# Patient Record
Sex: Male | Born: 1980
Health system: Southern US, Community
[De-identification: ages and names within clinical notes are randomized; demographics above are authoritative.]

## PROBLEM LIST (undated history)

## (undated) ENCOUNTER — Ambulatory Visit (HOSPITAL_COMMUNITY): Admission: EM | Payer: 59 | Source: Home / Self Care | Attending: Family Medicine | Admitting: Family Medicine

## (undated) DIAGNOSIS — I10 Essential (primary) hypertension: Secondary | ICD-10-CM

## (undated) HISTORY — PX: WRIST SURGERY: SHX841

---

## 2011-12-03 ENCOUNTER — Encounter (HOSPITAL_BASED_OUTPATIENT_CLINIC_OR_DEPARTMENT_OTHER): Payer: Self-pay | Admitting: *Deleted

## 2011-12-03 ENCOUNTER — Emergency Department (HOSPITAL_BASED_OUTPATIENT_CLINIC_OR_DEPARTMENT_OTHER)
Admission: EM | Admit: 2011-12-03 | Discharge: 2011-12-03 | Disposition: A | Discharge status: Home / Self Care | Payer: Self-pay | Attending: Emergency Medicine | Admitting: Emergency Medicine

## 2011-12-03 DIAGNOSIS — F17200 Nicotine dependence, unspecified, uncomplicated: Secondary | ICD-10-CM | POA: Insufficient documentation

## 2011-12-03 DIAGNOSIS — L0291 Cutaneous abscess, unspecified: Secondary | ICD-10-CM

## 2011-12-03 DIAGNOSIS — H60399 Other infective otitis externa, unspecified ear: Secondary | ICD-10-CM | POA: Insufficient documentation

## 2011-12-03 HISTORY — DX: Essential (primary) hypertension: I10

## 2011-12-03 MED ORDER — CEPHALEXIN 500 MG PO CAPS: 500.0000 mg | ORAL_CAPSULE | Freq: Four times a day (QID) | ORAL | Status: AC

## 2011-12-03 MED ORDER — HYDROCODONE-ACETAMINOPHEN 5-325 MG PO TABS
1.0000 | ORAL_TABLET | Freq: Once | ORAL | Status: AC
  Administered 2011-12-03: 1 via ORAL
  Filled 2011-12-03: qty 1

## 2011-12-03 MED ORDER — CEPHALEXIN 250 MG PO CAPS
500.0000 mg | ORAL_CAPSULE | Freq: Once | ORAL | Status: AC
  Administered 2011-12-03: 500 mg via ORAL
  Filled 2011-12-03: qty 2

## 2011-12-03 MED ORDER — HYDROCODONE-ACETAMINOPHEN 5-325 MG PO TABS: 1.0000 | ORAL_TABLET | ORAL | Status: AC | PRN

## 2011-12-03 NOTE — ED Provider Notes (Signed)
History     CSN: [id number]  Arrival date & time 12/03/11  1331   First MD Initiated Contact with Patient 12/03/11 1515      Chief Complaint  Patient presents with  . Abscess    (Consider location/radiation/quality/duration/timing/severity/associated sxs/prior treatment) HPI Comments: Ryan Moss presents for treatment of an abscess at the verge of his right ear canal which has been intermittently draining for the past 2 weeks but will not resolve and has now become more painful.    He denies fevers,  Chills, facial swelling, nausea and vomiting.  He does have a history of occasional skin blemishes which come and go, but they have always resolved without intervention.  He denies other complaints.  The history is provided by the patient and the spouse.    Past Medical History  Diagnosis Date  . Hypertension     History reviewed. No pertinent past surgical history.  History reviewed. No pertinent family history.  History  Substance Use Topics  . Smoking status: Current Everyday Smoker -- 1.0 packs/day  . Smokeless tobacco: Not on file  . Alcohol Use: No      Review of Systems  Constitutional: Negative for fever and chills.  HENT: Negative for facial swelling.   Respiratory: Negative for shortness of breath and wheezing.   Skin:       Otherwise negative     Allergies  Review of patient's allergies indicates no known allergies.  Home Medications   Current Outpatient Rx  Name Route Sig Dispense Refill  . ACETAMINOPHEN 500 MG PO TABS Oral Take 1,000 mg by mouth every 6 (six) hours as needed. For pain.    Marland Kitchen EAR DROPS OT Otic Place 3-5 drops in ear(s) daily as needed. For ear pain.    . CEPHALEXIN 500 MG PO CAPS Oral Take 1 capsule (500 mg total) by mouth 4 (four) times daily. 40 capsule 0  . HYDROCODONE-ACETAMINOPHEN 5-325 MG PO TABS Oral Take 1 tablet by mouth every 4 (four) hours as needed for pain. 15 tablet 0    BP 145/79  Pulse 57  Temp 98.4 F (36.9 C)  (Oral)  Resp 20  Ht 5\' 8"  (1.727 m)  Wt 190 lb (86.183 kg)  BMI 28.89 kg/m2  SpO2 98%  Physical Exam  Constitutional: He appears well-developed and well-nourished. No distress.  HENT:  Head: Normocephalic.  Neck: Neck supple.  Cardiovascular: Normal rate.   Pulmonary/Chest: Effort normal. He has no wheezes.  Musculoskeletal: Normal range of motion. He exhibits no edema.  Skin:       Small, raised abscess at right distal ear canal, fluctuant,nondraining central punctum.  He has several sebum filled large pores on the medial helix.      ED Course  Procedures (including critical care time)  Labs Reviewed - No data to display No results found.   1. Abscess    INCISION AND DRAINAGE Performed by: Burgess Amor Consent: Verbal consent obtained. Risks and benefits: risks, benefits and alternatives were discussed Type: abscess  Body area: right outer ear canal  Anesthesia: none Local anesthetic:none  Anesthetic total: na Complexity: simple   Drainage: purulent  Drainage amount: small amount of purulent drainage along with sebum  Packing material: not amenable to packing. Patient tolerance: Patient tolerated the procedure well with no immediate complications.      MDM  Pt placed on keflex along with hydrocodone for pain relief.  Suggested warm compresses and gentle massage to keep the area opened and draining.  Pt has scattered plugged comedones on face and ears and on surface of current infection - history and presentation not consistent with possible mrsa - more suggestive of sebaceous cyst/ hydradenitis source of skin problems.         Burgess Amor, [place] 12/03/11 2318

## 2011-12-03 NOTE — ED Notes (Signed)
Pt c/o abscess to right ear x 2 weeks

## 2011-12-04 NOTE — ED Provider Notes (Signed)
Medical screening examination/treatment/procedure(s) were performed by non-physician practitioner and as supervising physician I was immediately available for consultation/collaboration.   Hurman Horn, MD 12/04/11 [phone]

## 2015-09-28 ENCOUNTER — Encounter (HOSPITAL_BASED_OUTPATIENT_CLINIC_OR_DEPARTMENT_OTHER): Payer: Self-pay

## 2015-09-28 ENCOUNTER — Emergency Department (HOSPITAL_BASED_OUTPATIENT_CLINIC_OR_DEPARTMENT_OTHER)
Admission: EM | Admit: 2015-09-28 | Discharge: 2015-09-28 | Disposition: A | Discharge status: Home / Self Care | Payer: Self-pay | Attending: Emergency Medicine | Admitting: Emergency Medicine

## 2015-09-28 DIAGNOSIS — F17200 Nicotine dependence, unspecified, uncomplicated: Secondary | ICD-10-CM | POA: Insufficient documentation

## 2015-09-28 DIAGNOSIS — W57XXXA Bitten or stung by nonvenomous insect and other nonvenomous arthropods, initial encounter: Secondary | ICD-10-CM | POA: Insufficient documentation

## 2015-09-28 DIAGNOSIS — S90562A Insect bite (nonvenomous), left ankle, initial encounter: Secondary | ICD-10-CM | POA: Insufficient documentation

## 2015-09-28 DIAGNOSIS — Y939 Activity, unspecified: Secondary | ICD-10-CM | POA: Insufficient documentation

## 2015-09-28 DIAGNOSIS — I10 Essential (primary) hypertension: Secondary | ICD-10-CM | POA: Insufficient documentation

## 2015-09-28 DIAGNOSIS — Y999 Unspecified external cause status: Secondary | ICD-10-CM | POA: Insufficient documentation

## 2015-09-28 DIAGNOSIS — Y929 Unspecified place or not applicable: Secondary | ICD-10-CM | POA: Insufficient documentation

## 2015-09-28 MED ORDER — HYDROCORTISONE 2.5 % EX LOTN: TOPICAL_LOTION | Freq: Two times a day (BID) | CUTANEOUS | Status: DC

## 2015-09-28 MED ORDER — DOXYCYCLINE HYCLATE 100 MG PO CAPS: 100.0000 mg | ORAL_CAPSULE | Freq: Two times a day (BID) | ORAL | Status: DC

## 2015-09-28 NOTE — ED Notes (Signed)
Pt reports possible insect bite to left lateral ankle - reports onset of pain localized to area yesterday evening, redness noted last night with edema. Pt states he was he was working outside in a shed yesterday and noticed insects crawling around.

## 2015-09-28 NOTE — Discharge Instructions (Signed)
Cellulitis Cellulitis is an infection of the skin and the tissue beneath it. The infected area is usually red and tender. Cellulitis occurs most often in the arms and lower legs.  CAUSES  Cellulitis is caused by bacteria that enter the skin through cracks or cuts in the skin. The most common types of bacteria that cause cellulitis are staphylococci and streptococci. SIGNS AND SYMPTOMS   Redness and warmth.  Swelling.  Tenderness or pain.  Fever. DIAGNOSIS  Your health care provider can usually determine what is wrong based on a physical exam. Blood tests may also be done. TREATMENT  Treatment usually involves taking an antibiotic medicine. HOME CARE INSTRUCTIONS   Take your antibiotic medicine as directed by your health care provider. Finish the antibiotic even if you start to feel better.  Keep the infected arm or leg elevated to reduce swelling.  Apply a warm cloth to the affected area up to 4 times per day to relieve pain.  Take medicines only as directed by your health care provider.  Keep all follow-up visits as directed by your health care provider. SEEK MEDICAL CARE IF:  1. You notice red streaks coming from the infected area. 2. Your red area gets larger or turns dark in color. 3. Your bone or joint underneath the infected area becomes painful after the skin has healed. 4. Your infection returns in the same area or another area. 5. You notice a swollen bump in the infected area. 6. You develop new symptoms. 7. You have a fever. SEEK IMMEDIATE MEDICAL CARE IF:   You feel very sleepy.  You develop vomiting or diarrhea.  You have a general ill feeling (malaise) with muscle aches and pains.   This information is not intended to replace advice given to you by your health care provider. Make sure you discuss any questions you have with your health care provider.   Document Released: 01/20/2005 Document Revised: 01/01/2015 Document Reviewed: 06/28/2011 Elsevier  Interactive Patient Education 2016 Elsevier Inc.  Tick Bite Information Ticks are insects that attach themselves to the skin and draw blood for food. There are various types of ticks. Common types include wood ticks and deer ticks. Most ticks live in shrubs and grassy areas. Ticks can climb onto your body when you make contact with leaves or grass where the tick is waiting. The most common places on the body for ticks to attach themselves are the scalp, neck, armpits, waist, and groin. Most tick bites are harmless, but sometimes ticks carry germs that cause diseases. These germs can be spread to a person during the tick's feeding process. The chance of a disease spreading through a tick bite depends on:   The type of tick.  Time of year.   How long the tick is attached.   Geographic location.  HOW CAN YOU PREVENT TICK BITES? Take these steps to help prevent tick bites when you are outdoors:  Wear protective clothing. Long sleeves and long pants are best.   Wear white clothes so you can see ticks more easily.  Tuck your pant legs into your socks.   If walking on a trail, stay in the middle of the trail to avoid brushing against bushes.  Avoid walking through areas with long grass.  Put insect repellent on all exposed skin and along boot tops, pant legs, and sleeve cuffs.   Check clothing, hair, and skin repeatedly and before going inside.   Brush off any ticks that are not attached.  Take a  shower or bath as soon as possible after being outdoors.  WHAT IS THE PROPER WAY TO REMOVE A TICK? Ticks should be removed as soon as possible to help prevent diseases caused by tick bites. 8. If latex gloves are available, put them on before trying to remove a tick.  9. Using fine-point tweezers, grasp the tick as close to the skin as possible. You may also use curved forceps or a tick removal tool. Grasp the tick as close to its head as possible. Avoid grasping the tick on its  body. 10. Pull gently with steady upward pressure until the tick lets go. Do not twist the tick or jerk it suddenly. This may break off the tick's head or mouth parts. 11. Do not squeeze or crush the tick's body. This could force disease-carrying fluids from the tick into your body.  12. After the tick is removed, wash the bite area and your hands with soap and water or other disinfectant such as alcohol. 13. Apply a small amount of antiseptic cream or ointment to the bite site.  14. Wash and disinfect any instruments that were used.  Do not try to remove a tick by applying a hot match, petroleum jelly, or fingernail polish to the tick. These methods do not work and may increase the chances of disease being spread from the tick bite.  WHEN SHOULD YOU SEEK MEDICAL CARE? Contact your health care provider if you are unable to remove a tick from your skin or if a part of the tick breaks off and is stuck in the skin.  After a tick bite, you need to be aware of signs and symptoms that could be related to diseases spread by ticks. Contact your health care provider if you develop any of the following in the days or weeks after the tick bite:  Unexplained fever.  Rash. A circular rash that appears days or weeks after the tick bite may indicate the possibility of Lyme disease. The rash may resemble a target with a bull's-eye and may occur at a different part of your body than the tick bite.  Redness and swelling in the area of the tick bite.   Tender, swollen lymph glands.   Diarrhea.   Weight loss.   Cough.   Fatigue.   Muscle, joint, or bone pain.   Abdominal pain.   Headache.   Lethargy or a change in your level of consciousness.  Difficulty walking or moving your legs.   Numbness in the legs.   Paralysis.  Shortness of breath.   Confusion.   Repeated vomiting.    This information is not intended to replace advice given to you by your health care provider.  Make sure you discuss any questions you have with your health care provider.   Document Released: 04/09/2000 Document Revised: 05/03/2014 Document Reviewed: 09/20/2012 Elsevier Interactive Patient Education [employer]2016 Elsevier Inc.

## 2015-09-28 NOTE — ED Provider Notes (Signed)
CSN: [id number]     Arrival date & time 09/28/15  0850 History   First MD Initiated Contact with Patient 09/28/15 [phone]     Chief Complaint  Patient presents with  . Insect Bite    Sabino Dickravis Louis is a 35 y.o. male who presents to the ED complaining of an insect bite to his left lateral ankle since yesterday. The patient reports he was cleaning a shed yesterday and he saw lots of insects. He reports last night he noticed pain, swelling, redness and itching to his left lateral ankle. This has persisted today. He has put coco butter on his insect bite. He did not see an insect bite him. He denies fevers, body aches, other insect bites, abdominal pain, nausea, vomiting, neck pain, other rashes.    The history is provided by the patient. No language interpreter was used.    Past Medical History  Diagnosis Date  . Hypertension    History reviewed. No pertinent past surgical history. History reviewed. No pertinent family history. Social History  Substance Use Topics  . Smoking status: Current Every Day Smoker -- 1.00 packs/day  . Smokeless tobacco: None  . Alcohol Use: No    Review of Systems  Constitutional: Negative for fever.  Gastrointestinal: Negative for nausea, vomiting and abdominal pain.  Musculoskeletal: Negative for myalgias, arthralgias and neck pain.  Skin: Positive for color change and rash.  Neurological: Negative for weakness and numbness.      Allergies  Review of patient's allergies indicates no known allergies.  Home Medications   Prior to Admission medications   Medication Sig Start Date End Date Taking? Authorizing Provider  acetaminophen (TYLENOL) 500 MG tablet Take 1,000 mg by mouth every 6 (six) hours as needed. For pain.    Historical Provider, MD  Carbamide Peroxide (EAR DROPS OT) Place 3-5 drops in ear(s) daily as needed. For ear pain.    Historical Provider, MD  doxycycline (VIBRAMYCIN) 100 MG capsule Take 1 capsule (100 mg total) by mouth 2 (two)  times daily. 09/28/15   Everlene FarrierWilliam Rayquan Amrhein, PA-C  hydrocortisone 2.5 % lotion Apply topically 2 (two) times daily. 09/28/15   Everlene FarrierWilliam Franchon Ketterman, PA-C   BP 127/83 mmHg  Pulse 94  Temp(Src) 98.9 F (37.2 C) (Oral)  Resp 20  Ht 5\' 8"  (1.727 m)  Wt 81.647 kg  BMI 27.38 kg/m2  SpO2 98% Physical Exam  Constitutional: He appears well-developed and well-nourished. No distress.  Nontoxic appearing.  HENT:  Head: Normocephalic and atraumatic.  Eyes: Right eye exhibits no discharge. Left eye exhibits no discharge.  Cardiovascular: Normal rate, regular rhythm and intact distal pulses.   Bilateral dorsalis pedis and posterior tibialis pulses are intact. Good capillary refill to his distal toes.  Pulmonary/Chest: Effort normal. No respiratory distress.  Musculoskeletal: Normal range of motion.  See skin. Good left ankle ROM. No calf edema or tenderness bilaterally.   Neurological: He is alert. Coordination normal.  Skin: Skin is warm and dry. He is not diaphoretic. There is erythema. No pallor.  4 cm area of erythema and mild edema to his left lateral ankle. No abscess. No bite marks. No open wounds. No fluctuance. No vesicles or bulla. No streaking erythema.   Psychiatric: He has a normal mood and affect. His behavior is normal.  Nursing note and vitals reviewed.   ED Course  Procedures (including critical care time) Labs Review Labs Reviewed - No data to display  Imaging Review No results found.    EKG Interpretation None  Filed Vitals:   09/28/15 0859  BP: 127/83  Pulse: 94  Temp: 98.9 F (37.2 C)  TempSrc: Oral  Resp: 20  Height:  (1.727 m)  Weight: 81.647 kg  SpO2: 98%     MDM   Meds given in ED:  Medications - No data to display  New Prescriptions   DOXYCYCLINE (VIBRAMYCIN) 100 MG CAPSULE    Take 1 capsule (100 mg total) by mouth 2 (two) times daily.   HYDROCORTISONE 2.5 % LOTION    Apply topically 2 (two) times daily.    Final diagnoses:  Insect bite   This   is a 35 y.o. male who presents to the ED complaining of an insect bite to his left lateral ankle since yesterday. The patient reports he was cleaning a shed yesterday and he saw lots of insects. He reports last night he noticed pain, swelling, redness and itching to his left lateral ankle. This has persisted today. He has put coco butter on his insect bite. He did not see an insect bite him. He denies fevers, body aches.  On exam the patient is afebrile and non-toxic appearing. He has a 4 cm area of erythema concerning for cellulitis to his left lateral ankle. No abscess or vesicles. No streaking erythema. Will discharge with Rx for doxycycline and hydrocortisone lotion for itching. I encouraged close follow up by PCP for recheck. I discussed strict and specific return precautions. I advised the patient to follow-up with their primary care provider this week. I advised the patient to return to the emergency department with new or worsening symptoms or new concerns. The patient verbalized understanding and agreement with plan.       Everlene Farrier, PA-C 09/28/15 [id number]  Lavera Guise, MD 09/28/15 1054

## 2015-09-29 MED FILL — DOXYCYCLINE HYC 100 MG CAP: 100 | 10 days supply | Qty: 20 | Fill #0

## 2015-09-29 MED FILL — HYDROCORTISONE 2.5% LOTION: 2.5 | 20 days supply | Qty: 59 | Fill #0

## 2015-10-01 ENCOUNTER — Inpatient Hospital Stay (HOSPITAL_BASED_OUTPATIENT_CLINIC_OR_DEPARTMENT_OTHER)
Admission: EM | Admit: 2015-10-01 | Discharge: 2015-10-04 | DRG: 603 | Disposition: A | Discharge status: Home / Self Care | Payer: Self-pay | Attending: Family Medicine | Admitting: Family Medicine

## 2015-10-01 ENCOUNTER — Encounter (HOSPITAL_BASED_OUTPATIENT_CLINIC_OR_DEPARTMENT_OTHER): Payer: Self-pay

## 2015-10-01 ENCOUNTER — Emergency Department (HOSPITAL_BASED_OUTPATIENT_CLINIC_OR_DEPARTMENT_OTHER): Payer: Self-pay

## 2015-10-01 DIAGNOSIS — R7303 Prediabetes: Secondary | ICD-10-CM | POA: Diagnosis present

## 2015-10-01 DIAGNOSIS — Z87891 Personal history of nicotine dependence: Secondary | ICD-10-CM

## 2015-10-01 DIAGNOSIS — L0390 Cellulitis, unspecified: Secondary | ICD-10-CM | POA: Diagnosis present

## 2015-10-01 DIAGNOSIS — L03116 Cellulitis of left lower limb: Principal | ICD-10-CM | POA: Insufficient documentation

## 2015-10-01 DIAGNOSIS — I10 Essential (primary) hypertension: Secondary | ICD-10-CM | POA: Diagnosis present

## 2015-10-01 LAB — CBC WITH DIFFERENTIAL/PLATELET
BASOS PCT: 0 %
Basophils Absolute: 0 10*3/uL (ref 0.0–0.1)
EOS PCT: 2 %
Eosinophils Absolute: 0.2 10*3/uL (ref 0.0–0.7)
HEMATOCRIT: 36.8 % — AB (ref 39.0–52.0)
HEMOGLOBIN: 12.8 g/dL — AB (ref 13.0–17.0)
LYMPHS PCT: 24 %
Lymphs Abs: 2.7 10*3/uL (ref 0.7–4.0)
MCH: 28.3 pg (ref 26.0–34.0)
MCHC: 34.8 g/dL (ref 30.0–36.0)
MCV: 81.4 fL (ref 78.0–100.0)
MONO ABS: 1.5 10*3/uL — AB (ref 0.1–1.0)
Monocytes Relative: 14 %
Neutro Abs: 6.6 10*3/uL (ref 1.7–7.7)
Neutrophils Relative %: 60 %
Platelets: 240 10*3/uL (ref 150–400)
RBC: 4.52 MIL/uL (ref 4.22–5.81)
RDW: 13 % (ref 11.5–15.5)
WBC: 11 10*3/uL — AB (ref 4.0–10.5)

## 2015-10-01 LAB — COMPREHENSIVE METABOLIC PANEL
ALBUMIN: 4 g/dL (ref 3.5–5.0)
ALK PHOS: 80 U/L (ref 38–126)
ALT: 30 U/L (ref 17–63)
ANION GAP: 7 (ref 5–15)
AST: 34 U/L (ref 15–41)
BILIRUBIN TOTAL: 1.4 mg/dL — AB (ref 0.3–1.2)
BUN: 19 mg/dL (ref 6–20)
CALCIUM: 8.6 mg/dL — AB (ref 8.9–10.3)
CO2: 29 mmol/L (ref 22–32)
CREATININE: 1.04 mg/dL (ref 0.61–1.24)
Chloride: 103 mmol/L (ref 101–111)
GFR calc Af Amer: >60 mL/min (ref >60)
GFR calc non Af Amer: >60 mL/min (ref >60)
GLUCOSE: 134 mg/dL — AB (ref 65–99)
Potassium: 3.6 mmol/L (ref 3.5–5.1)
Sodium: 139 mmol/L (ref 135–145)
TOTAL PROTEIN: 7.3 g/dL (ref 6.5–8.1)

## 2015-10-01 LAB — SEDIMENTATION RATE: Sed Rate: 7 mm/hr (ref 0–16)

## 2015-10-01 MED ORDER — HYDROMORPHONE HCL 1 MG/ML IJ SOLN
1.0000 mg | Freq: Once | INTRAMUSCULAR | Status: AC
  Administered 2015-10-01: 1 mg via INTRAVENOUS
  Filled 2015-10-01: qty 1

## 2015-10-01 MED ORDER — SODIUM CHLORIDE 0.9 % IV BOLUS (SEPSIS)
1000.0000 mL | Freq: Once | INTRAVENOUS | Status: AC
  Administered 2015-10-01: 1000 mL via INTRAVENOUS

## 2015-10-01 MED ORDER — VANCOMYCIN HCL 500 MG IV SOLR
INTRAVENOUS | Status: AC
  Filled 2015-10-01: qty 500

## 2015-10-01 MED ORDER — VANCOMYCIN HCL 10 G IV SOLR
1500.0000 mg | Freq: Once | INTRAVENOUS | Status: AC
  Administered 2015-10-01: 1500 mg via INTRAVENOUS
  Filled 2015-10-01: qty 1500

## 2015-10-01 MED ORDER — VANCOMYCIN HCL IN DEXTROSE 1-5 GM/200ML-% IV SOLN
INTRAVENOUS | Status: AC
  Filled 2015-10-01: qty 200

## 2015-10-01 NOTE — ED Notes (Signed)
MD at bedside. 

## 2015-10-01 NOTE — ED Provider Notes (Signed)
CSN: [id number]     Arrival date & time 10/01/15  1847 History  By signing my name below, I, Soijett Blue, attest that this documentation has been prepared under the direction and in the presence of Harvel Quale, MD. Electronically Signed: Soijett Blue, ED Scribe. 10/01/2015. 8:52 PM.   Chief Complaint  Patient presents with  . Wound Infection      The history is provided by the patient. No language interpreter was used.    HPI Comments: Ryan Moss is a 35 y.o. male with a PMHx of HTN, who presents to the Emergency Department complaining of wound infection to left ankle onset 4 days. Pt initially had an insect bite to his left ankle that was small and red, pt is unsure of the insect. Pt reports that he was seen in the ED recently and Rx Doxycycline for his symptoms but she reports she has been taking. Denies having issues with bad infections in the past. Denies having ankle/foot pain in the past. Pt notes that he doesn't feel well. He states that he is having associated symptoms of redness to left ankle, chills, left foot pain, gait problem due to pain, and left ankle swelling. He states that he has tried Rx abx with no relief for his symptoms. He denies any other symptoms. Denies PMHx of DM at this time. Denies allergies to medications at this time.   Per pt chart review: Pt was seen in the ED on 09/28/2015 for insect bite to left ankle. Pt was Rx doxycycline and hydrocortisone cream for his symptoms.   Past Medical History  Diagnosis Date  . Hypertension    History reviewed. No pertinent past surgical history. No family history on file. Social History  Substance Use Topics  . Smoking status: Current Every Day Smoker -- 1.00 packs/day  . Smokeless tobacco: None  . Alcohol Use: No    Review of Systems  Constitutional: Positive for chills. Negative for fever.  Musculoskeletal: Positive for joint swelling (left ankle), arthralgias (left foot and ankle) and gait problem (due to  pain).  Skin: Positive for color change (redness to left ankle).  All other systems reviewed and are negative.   Allergies  Review of patient's allergies indicates no known allergies.  Home Medications   Prior to Admission medications   Medication Sig Start Date End Date Taking? Authorizing Provider  doxycycline (VIBRAMYCIN) 100 MG capsule Take 1 capsule (100 mg total) by mouth 2 (two) times daily. 09/28/15   Waynetta Pean, PA-C  hydrocortisone 2.5 % lotion Apply topically 2 (two) times daily. 09/28/15   Waynetta Pean, PA-C   BP 120/77 mmHg  Pulse 91  Temp(Src) 98.3 F (36.8 C) (Oral)  Resp 20  Ht 5' 8"  (1.727 m)  Wt 180 lb (81.647 kg)  BMI 27.38 kg/m2  SpO2 92% Physical Exam  Constitutional: He is oriented to person, place, and time. He appears well-developed and well-nourished. No distress.  Non-toxic, but ill appearing.   HENT:  Head: Normocephalic and atraumatic.  Eyes: EOM are normal.  Neck: Neck supple.  Cardiovascular: Regular rhythm and normal heart sounds.  Tachycardia present.  Exam reveals no gallop and no friction rub.   No murmur heard. Pulmonary/Chest: Effort normal and breath sounds normal. No respiratory distress. He has no wheezes. He has no rales.  Abdominal: Soft. He exhibits no distension. There is no tenderness.  Musculoskeletal:       Left ankle: He exhibits decreased range of motion (due to pain) and swelling.  Tenderness.  Significant circumferential swelling and erythema to left ankle. Significant tenderness to left ankle. With minimal movement, pt has pain to his left ankle. Decreased ROM due to pain. Good dp pulses.   Neurological: He is alert and oriented to person, place, and time.  Skin: Skin is warm and dry.  Psychiatric: He has a normal mood and affect. His behavior is normal.  Nursing note and vitals reviewed.   ED Course  Procedures (including critical care time) DIAGNOSTIC STUDIES: Oxygen Saturation is 97% on RA, nl by my interpretation.     COORDINATION OF CARE: 8:50 PM Discussed treatment plan with pt at bedside which includes left ankle xray, labs, dilaudid, vancomycin, and pt agreed to plan.    Labs Review Labs Reviewed  CBC WITH DIFFERENTIAL/PLATELET - Abnormal; Notable for the following:    WBC 11.0 (*)    Hemoglobin 12.8 (*)    HCT 36.8 (*)    Monocytes Absolute 1.5 (*)    All other components within normal limits  COMPREHENSIVE METABOLIC PANEL - Abnormal; Notable for the following:    Glucose, Bld 134 (*)    Calcium 8.6 (*)    Total Bilirubin 1.4 (*)    All other components within normal limits  CULTURE, BLOOD (ROUTINE X 2)  CULTURE, BLOOD (ROUTINE X 2)  SEDIMENTATION RATE  C-REACTIVE PROTEIN    Imaging Review Dg Ankle Complete Left  10/01/2015  CLINICAL DATA:  Left ankle pain following insect bite laterally 1 week ago, initial encounter EXAM: LEFT ANKLE COMPLETE - 3+ VIEW COMPARISON:  None. FINDINGS: Soft tissue swelling is noted laterally. No acute fracture or dislocation is seen. No gross bony abnormality is noted. IMPRESSION: Soft tissue swelling consistent with the injury. No acute bony abnormality noted. Electronically Signed   By: Inez Catalina M.D.   On: 10/01/2015 22:57   I have personally reviewed and evaluated these images and lab results as part of my medical decision-making.   EKG Interpretation None      MDM  Patient was seen and evaluated in stable condition. Patient appeared ill but not toxic on examination. Patient with significant pain and swelling over the lateral foot and ankle. Patient did have significant pain with range of motion of the ankle itself. Skin was darkened and warm up to the mid shin. Physical examination consistent with cellulitis. Patient mildly tachycardic and with leukocytosis. Patient was given a dose of vancomycin. ESR was normal. X-ray without acute finding other than soft tissue swelling. At this time feel this is most likely cellulitis and not a septic joint. I  would not feel comfortable doing an arthrocentesis in light of the infected skin surrounding the ankle joints at this time anyway. Patient does have a normal ESR and clinically I feel this is most likely cellulitis. Discussed case with Dr. Aggie Moats from [facility] who agreed with admission. Patient was admitted under his care in stable condition. Final diagnoses:  Cellulitis of left lower extremity    1. Cellulitis, left lower extremity  I personally performed the services described in this documentation, which was scribed in my presence. The recorded information has been reviewed and is accurate.   Harvel Quale, MD 10/02/15 [phone]

## 2015-10-01 NOTE — ED Notes (Signed)
Dx with infection to left ankle Sunday-started abx-states increase in swelling-slow limping gait

## 2015-10-02 ENCOUNTER — Encounter (HOSPITAL_COMMUNITY): Payer: Self-pay | Admitting: Family Medicine

## 2015-10-02 DIAGNOSIS — I159 Secondary hypertension, unspecified: Secondary | ICD-10-CM

## 2015-10-02 DIAGNOSIS — I10 Essential (primary) hypertension: Secondary | ICD-10-CM

## 2015-10-02 DIAGNOSIS — L03116 Cellulitis of left lower limb: Principal | ICD-10-CM | POA: Insufficient documentation

## 2015-10-02 DIAGNOSIS — L0390 Cellulitis, unspecified: Secondary | ICD-10-CM | POA: Diagnosis present

## 2015-10-02 LAB — BASIC METABOLIC PANEL
ANION GAP: 5 (ref 5–15)
BUN: 13 mg/dL (ref 6–20)
CHLORIDE: 106 mmol/L (ref 101–111)
CO2: 26 mmol/L (ref 22–32)
Calcium: 8.2 mg/dL — ABNORMAL LOW (ref 8.9–10.3)
Creatinine, Ser: 0.95 mg/dL (ref 0.61–1.24)
GFR calc Af Amer: >60 mL/min (ref >60)
Glucose, Bld: 163 mg/dL — ABNORMAL HIGH (ref 65–99)
POTASSIUM: 3.6 mmol/L (ref 3.5–5.1)
SODIUM: 137 mmol/L (ref 135–145)

## 2015-10-02 LAB — CBC
HEMATOCRIT: 35.9 % — AB (ref 39.0–52.0)
HEMOGLOBIN: 11.6 g/dL — AB (ref 13.0–17.0)
MCH: 26.6 pg (ref 26.0–34.0)
MCHC: 32.3 g/dL (ref 30.0–36.0)
MCV: 82.3 fL (ref 78.0–100.0)
Platelets: 226 10*3/uL (ref 150–400)
RBC: 4.36 MIL/uL (ref 4.22–5.81)
RDW: 13.3 % (ref 11.5–15.5)
WBC: 9.1 10*3/uL (ref 4.0–10.5)

## 2015-10-02 LAB — C-REACTIVE PROTEIN: CRP: 2.8 mg/dL — ABNORMAL HIGH (ref <1.0)

## 2015-10-02 MED ORDER — POLYETHYLENE GLYCOL 3350 17 G PO PACK: 17.0000 g | PACK | Freq: Every day | ORAL | Status: DC | PRN

## 2015-10-02 MED ORDER — ENOXAPARIN SODIUM 40 MG/0.4ML ~~LOC~~ SOLN
40.0000 mg | SUBCUTANEOUS | Status: DC
  Administered 2015-10-02 – 2015-10-03 (×2): 40 mg via SUBCUTANEOUS
  Filled 2015-10-02 (×2): qty 0.4

## 2015-10-02 MED ORDER — SODIUM CHLORIDE 0.9% FLUSH
3.0000 mL | Freq: Two times a day (BID) | INTRAVENOUS | Status: DC
  Administered 2015-10-02 – 2015-10-03 (×4): 3 mL via INTRAVENOUS

## 2015-10-02 MED ORDER — VANCOMYCIN HCL IN DEXTROSE 1-5 GM/200ML-% IV SOLN
1000.0000 mg | Freq: Two times a day (BID) | INTRAVENOUS | Status: DC
  Administered 2015-10-02 – 2015-10-04 (×5): 1000 mg via INTRAVENOUS
  Filled 2015-10-02 (×6): qty 200

## 2015-10-02 MED ORDER — KCL IN DEXTROSE-NACL 20-5-0.45 MEQ/L-%-% IV SOLN
INTRAVENOUS | Status: AC
  Administered 2015-10-02: 04:00:00 via INTRAVENOUS
  Filled 2015-10-02: qty 1000

## 2015-10-02 MED ORDER — ACETAMINOPHEN 325 MG PO TABS
650.0000 mg | ORAL_TABLET | Freq: Four times a day (QID) | ORAL | Status: DC | PRN
  Administered 2015-10-03: 650 mg via ORAL
  Filled 2015-10-02: qty 2

## 2015-10-02 MED ORDER — HYDROMORPHONE HCL 1 MG/ML IJ SOLN
0.5000 mg | INTRAMUSCULAR | Status: DC | PRN
  Administered 2015-10-02 – 2015-10-04 (×10): 0.5 mg via INTRAVENOUS
  Filled 2015-10-02 (×10): qty 1

## 2015-10-02 MED ORDER — COLCHICINE 0.6 MG PO TABS
1.2000 mg | ORAL_TABLET | Freq: Once | ORAL | Status: AC
Start: 2015-10-02 — End: 2015-10-02
  Administered 2015-10-02: 1.2 mg via ORAL
  Filled 2015-10-02: qty 2

## 2015-10-02 MED ORDER — HYDRALAZINE HCL 20 MG/ML IJ SOLN: 10.0000 mg | Freq: Three times a day (TID) | INTRAMUSCULAR | Status: DC | PRN

## 2015-10-02 MED ORDER — ACETAMINOPHEN 650 MG RE SUPP: 650.0000 mg | Freq: Four times a day (QID) | RECTAL | Status: DC | PRN

## 2015-10-02 NOTE — ED Notes (Signed)
Skin marker used to mark the area of infection on L ankle.

## 2015-10-02 NOTE — H&P (Signed)
History and Physical    Ryan Moss ZOX:[id number] DOB: 29-Jan-1981 DOA: 10/01/2015  PCP: No primary care provider on file.  Patient coming from: home  Chief Complaint: "I think I got bit or something."  HPI: Ryan Moss is a 35 y.o. male with medical history significant of 35 year old male with no sniff can past medical history went to the emergency room for worsening cellulitis. Patient states that he went to the ED 2 days ago after he been bitten by insect the day before while on a shed. Redness initially started on the shin with small circular. This spread to his foot his foot became red and swollen. Now stable to the point that patient cannot walk and bear weight on left side. Patient denies any fluctuance or area patient denies any temperature surgery. Patient has no history of IV drug use. Patient has no history of MRSA.  Denies fevers chills nausea vomiting cough wheezing syncope presyncope headache blurry vision double vision trouble hearing diarrhea constipation dysuria hematuria.  Patient when he presented to the emergency room today appeared to be in mild to moderate illness distress. He was tachycardic. He responded rapidly to vancomycin and IV hydration. ED provider than contact the hospitalist service for admission.  Review of Systems: As per HPI otherwise 10 point review of systems negative.   Past Medical History  Diagnosis Date  . Hypertension     History reviewed. No pertinent past surgical history.   reports that he has been smoking.  He does not have any smokeless tobacco history on file. He reports that he does not drink alcohol or use illicit drugs.  No Known Allergies  Family History  Problem Relation Age of Onset  . Hypertension Mother      Prior to Admission medications   Medication Sig Start Date End Date Taking? Authorizing Provider  doxycycline (VIBRAMYCIN) 100 MG capsule Take 1 capsule (100 mg total) by mouth 2 (two) times daily. 09/28/15   Everlene Farrier, PA-C  hydrocortisone 2.5 % lotion Apply topically 2 (two) times daily. 09/28/15   Everlene Farrier, PA-C    Physical Exam: Filed Vitals:   10/01/15 2250 10/01/15 2300 10/02/15 0039 10/02/15 0123  BP: 120/77 114/79 130/80 119/81  Pulse: 91 93 93 86  Temp:   98.5 F (36.9 C) 98.8 F (37.1 C)  TempSrc:    Oral  Resp: Height:      Weight:      SpO2: 92% 92% 93% 93%      Constitutional: NAD, calm, comfortable Filed Vitals:   10/01/15 2250 10/01/15 2300 10/02/15 0039 10/02/15 0123  BP: 120/77 114/79 130/80 119/81  Pulse: 91 93 93 86  Temp:   98.5 F (36.9 C) 98.8 F (37.1 C)  TempSrc:    Oral  Resp: Height:      Weight:      SpO2: 92% 92% 93% 93%   Eyes: PERRL, lids and conjunctivae normal ENMT: Mucous membranes are moist. Posterior pharynx clear of any exudate or lesions.Normal dentition.  Neck: normal, supple, no masses, no thyromegaly Respiratory: clear to auscultation bilaterally, no wheezing, no crackles. Normal respiratory effort. No accessory muscle use.  Cardiovascular: Regular rate and rhythm, no murmurs / rubs / gallops. No extremity edema. 2+ pedal pulses. No carotid bruits.  Abdomen: no tenderness, no masses palpated. No hepatosplenomegaly. Bowel sounds positive.  Musculoskeletal: no clubbing / cyanosis. No joint deformity upper and lower extremities. Good ROM, no contractures. Normal  muscle tone.  Skin: no rashes, lesions, ulcers. No induration. LLE swelling, induration and erythema.  Neurologic: CN 2-12 grossly intact. Sensation intact, DTR normal. Strength 5/5 in all 4.  Psychiatric: Normal judgment and insight. Alert and oriented x 3. Normal mood.   Labs on Admission: I have personally reviewed following labs and imaging studies  CBC:  Recent Labs Lab 10/01/15 2120  WBC 11.0*  NEUTROABS 6.6  HGB 12.8*  HCT 36.8*  MCV 81.4  PLT 240   Basic Metabolic Panel:  Recent Labs Lab 10/01/15 2120  NA 139  K 3.6  CL 103    CO2 29  GLUCOSE 134*  BUN 19  CREATININE 1.04  CALCIUM 8.6*   GFR: Estimated Creatinine Clearance: 94.1 mL/min (by C-G formula based on Cr of 1.04). Liver Function Tests:  Recent Labs Lab 10/01/15 2120  AST 34  ALT 30  ALKPHOS 80  BILITOT 1.4*  PROT 7.3  ALBUMIN 4.0   No results for input(s): LIPASE, AMYLASE in the last 168 hours. No results for input(s): AMMONIA in the last 168 hours. Coagulation Profile: No results for input(s): INR, PROTIME in the last 168 hours. Cardiac Enzymes: No results for input(s): CKTOTAL, CKMB, CKMBINDEX, TROPONINI in the last 168 hours. BNP (last 3 results) No results for input(s): PROBNP in the last 8760 hours. HbA1C: No results for input(s): HGBA1C in the last 72 hours. CBG: No results for input(s): GLUCAP in the last 168 hours. Lipid Profile: No results for input(s): CHOL, HDL, LDLCALC, TRIG, CHOLHDL, LDLDIRECT in the last 72 hours. Thyroid Function Tests: No results for input(s): TSH, T4TOTAL, FREET4, T3FREE, THYROIDAB in the last 72 hours. Anemia Panel: No results for input(s): VITAMINB12, FOLATE, FERRITIN, TIBC, IRON, RETICCTPCT in the last 72 hours. Urine analysis: No results found for: COLORURINE, APPEARANCEUR, LABSPEC, PHURINE, GLUCOSEU, HGBUR, BILIRUBINUR, KETONESUR, PROTEINUR, UROBILINOGEN, NITRITE, LEUKOCYTESUR Sepsis Labs: !!!!!!!!!!!!!!!!!!!!!!!!!!!!!!!!!!!!!!!!!!!! @LABRCNTIP (procalcitonin:4,lacticidven:4) )No results found for this or any previous visit (from the past 240 hour(s)).   Radiological Exams on Admission: Dg Ankle Complete Left  10/01/2015  CLINICAL DATA:  Left ankle pain following insect bite laterally 1 week ago, initial encounter EXAM: LEFT ANKLE COMPLETE - 3+ VIEW COMPARISON:  None. FINDINGS: Soft tissue swelling is noted laterally. No acute fracture or dislocation is seen. No gross bony abnormality is noted. IMPRESSION: Soft tissue swelling consistent with the injury. No acute bony abnormality noted.  Electronically Signed   By: Alcide CleverMark  Lukens M.D.   On: 10/01/2015 22:57    EKG: pending  Assessment/Plan Principal Problem:   Cellulitis Active Problems:   Hypertension  Cellulitis Admitted to telemetry bed can be downgraded to MedSurg when clinical improvement seen Patient started on vancomycin at outside facility Vancomycin dosing per pharmacy Questionable gout flare based on physical exam we'll try a 1.2 mg  Of colchicine  hypertension Patient is unsure of medication he is on daily When necessary hydralazine while in the hospital   DVT prophylaxis: lovenox  Code Status: full  Family Communication: none available Disposition Plan: home  Consults called: pharmacy  Admission status: tele    Haydee SalterPhillip M Hobbs MD [facility] Pager [phone]  If 7PM-7AM, please contact night-coverage [email or web address] Password [email or web address]  10/02/2015, 1:40 AM

## 2015-10-02 NOTE — Progress Notes (Signed)
Progress Note  10/01/2015 7:35 AM  Pt was seen and examined, H&P and orders reviewed.  Clinically he is improving on antibiotics (vanc) and IVFs.  Will D/C cardiac monitoring.  Continue vancomycin IV per pharmacy today.    Maryln Manuel. Johnson, MD

## 2015-10-02 NOTE — Progress Notes (Signed)
Pharmacy Antibiotic Note  Ryan Moss is a 35 y.o. male admitted on 10/01/2015 with cellulitis.  Pharmacy has been consulted for vancomycin dosing. Seen in the ED 6/4 and given doxy Rx. AF, wbc 11, CrCl~94.  Received vanc 1500mg  x 1 dose in the ED on 6/7 at 2152.  Plan: Vanc 1500mg  given in ED x1; then 1g IV q12h. Goal 10-15 Monitor clinical progress, c/s, renal function, abx plan/LOT VT@SS  as indicated F/u need for GN coverage?  Height: 5\' 8"  (172.7 cm) Weight: 180 lb (81.647 kg) IBW/kg (Calculated) : 68.4  Temp (24hrs), Avg:98.7 F (37.1 C), Min:98.3 F (36.8 C), Max:99 F (37.2 C)   Recent Labs Lab 10/01/15 2120  WBC 11.0*  CREATININE 1.04    Estimated Creatinine Clearance: 94.1 mL/min (by C-G formula based on Cr of 1.04).    No Known Allergies  Antimicrobials this admission: 6/8 vanc >>   Dose adjustments this admission:   Microbiology results: 6/7 BCx:    Ryan Moss, PharmD, [facility] CenterBCPS Clinical Pharmacist Pager [phone] 10/02/2015 2:13 AM

## 2015-10-03 DIAGNOSIS — R7303 Prediabetes: Secondary | ICD-10-CM

## 2015-10-03 LAB — HEMOGLOBIN A1C
HEMOGLOBIN A1C: 5.8 % — AB (ref 4.8–5.6)
MEAN PLASMA GLUCOSE: 120 mg/dL

## 2015-10-03 LAB — BASIC METABOLIC PANEL
Anion gap: 6 (ref 5–15)
BUN: 7 mg/dL (ref 6–20)
CALCIUM: 8.4 mg/dL — AB (ref 8.9–10.3)
CO2: 26 mmol/L (ref 22–32)
CREATININE: 0.87 mg/dL (ref 0.61–1.24)
Chloride: 107 mmol/L (ref 101–111)
GLUCOSE: 111 mg/dL — AB (ref 65–99)
Potassium: 3.8 mmol/L (ref 3.5–5.1)
Sodium: 139 mmol/L (ref 135–145)

## 2015-10-03 MED ORDER — DIPHENHYDRAMINE HCL 25 MG PO CAPS
25.0000 mg | ORAL_CAPSULE | ORAL | Status: DC | PRN
  Administered 2015-10-03 – 2015-10-04 (×2): 25 mg via ORAL
  Filled 2015-10-03 (×2): qty 1

## 2015-10-03 MED ORDER — DIPHENHYDRAMINE HCL 25 MG PO CAPS
25.0000 mg | ORAL_CAPSULE | Freq: Once | ORAL | Status: AC
  Administered 2015-10-03: 25 mg via ORAL
  Filled 2015-10-03: qty 1

## 2015-10-03 NOTE — Progress Notes (Signed)
Pt ambulated around unit several times today. Cellulitis to left leg improving

## 2015-10-03 NOTE — Progress Notes (Signed)
PROGRESS NOTE    Ryan Dickravis Horseman  WJX:[id number]  DOB: 04/05/1981  DOA: 10/01/2015 PCP: No primary care provider on file. Outpatient Specialists:   Hospital course: Ryan Moss is a 35 y.o. male with medical history significant of 35 year old male with no sniff can past medical history went to the emergency room for worsening cellulitis. Patient states that he went to the ED 2 days ago after he been bitten by insect the day before while on a shed. Redness initially started on the shin with small circular. This spread to his foot his foot became red and swollen. Now stable to the point that patient cannot walk and bear weight on left side. Patient denies any fluctuance or area patient denies any temperature surgery. Patient has no history of IV drug use. Patient has no history of MRSA.  Denies fevers chills nausea vomiting cough wheezing syncope presyncope headache blurry vision double vision trouble hearing diarrhea constipation dysuria hematuria.  Patient when he presented to the emergency room today appeared to be in mild to moderate illness distress. He was tachycardic. He responded rapidly to vancomycin and IV hydration. ED provider than contact the hospitalist service for admission.   Assessment & Plan:   Cellulitis LLE - improving but still with pain and edema, continue IV antibiotics and ambulate more today, hopefully can go home tomorrow on oral antibiotics.     Subjective: Still with pain and swelling but overall getting better  Objective: Filed Vitals:   10/02/15 1331 10/02/15 2249 10/03/15 0623 10/03/15 1443  BP: 141/74 125/72 121/70 138/77  Pulse: 77 72 77 75  Temp: 97.7 F (36.5 C) 98.7 F (37.1 C) 98.6 F (37 C) 98.9 F (37.2 C)  TempSrc: Oral Oral Oral Oral  Resp: 19   18  Height:      Weight:      SpO2: 98% 97% 97% 100%    Intake/Output Summary (Last 24 hours) at 10/03/15 1807 Last data filed at 10/03/15 [id number]  Gross per 24 hour  Intake      0 ml    Output   2250 ml  Net  -2250 ml   Filed Weights   10/01/15 1910 10/02/15 0123  Weight: 180 lb (81.647 kg) 175 lb 11.2 oz (79.697 kg)    Exam:  General exam: awake, alert no distress Respiratory system: Clear. No increased work of breathing. Cardiovascular system: S1 & S2 heard, RRR. No JVD, murmurs, gallops, clicks or pedal edema. Gastrointestinal system: Abdomen is nondistended, soft and nontender. Normal bowel sounds heard. Central nervous system: Alert and oriented. No focal neurological deficits. Extremities: LLE with edema and tenderness but overall improving, less redness, no spreading of infection  Data Reviewed: Basic Metabolic Panel:  Recent Labs Lab 10/01/15 2120 10/02/15 0250 10/03/15 0307  NA 139 137 139  K 3.6 3.6 3.8  CL 103 106 107  CO2 29 26 26   GLUCOSE 134* 163* 111*  BUN 19 13 7   CREATININE 1.04 0.95 0.87  CALCIUM 8.6* 8.2* 8.4*   Liver Function Tests:  Recent Labs Lab 10/01/15 2120  AST 34  ALT 30  ALKPHOS 80  BILITOT 1.4*  PROT 7.3  ALBUMIN 4.0   No results for input(s): LIPASE, AMYLASE in the last 168 hours. No results for input(s): AMMONIA in the last 168 hours. CBC:  Recent Labs Lab 10/01/15 2120 10/02/15 0250  WBC 11.0* 9.1  NEUTROABS 6.6  --   HGB 12.8* 11.6*  HCT 36.8* 35.9*  MCV 81.4 82.3  PLT 240  226   Cardiac Enzymes: No results for input(s): CKTOTAL, CKMB, CKMBINDEX, TROPONINI in the last 168 hours. BNP (last 3 results) No results for input(s): PROBNP in the last 8760 hours. CBG: No results for input(s): GLUCAP in the last 168 hours.  Recent Results (from the past 240 hour(s))  Blood culture (routine x 2)     Status: None (Preliminary result)   Collection Time: 25-Oct-2015  9:20 PM  Result Value Ref Range Status   Specimen Description BLOOD LEFT ANTECUBITAL  Final   Special Requests   Final    BOTTLES DRAWN AEROBIC AND ANAEROBIC AER 10cc ANA 8cc   Culture   Final    NO GROWTH 1 DAY Performed at [facility]    Report Status PENDING  Incomplete  Blood culture (routine x 2)     Status: None (Preliminary result)   Collection Time: Oct 25, 2015  9:50 PM  Result Value Ref Range Status   Specimen Description BLOOD LEFT FOREARM  Final   Special Requests   Final    BOTTLES DRAWN AEROBIC AND ANAEROBIC AER 5cc ANA 5cc   Culture   Final    NO GROWTH 1 DAY Performed at [facility]    Report Status PENDING  Incomplete     Studies: Dg Ankle Complete Left  Oct 25, 2015  CLINICAL DATA:  Left ankle pain following insect bite laterally 1 week ago, initial encounter EXAM: LEFT ANKLE COMPLETE - 3+ VIEW COMPARISON:  None. FINDINGS: Soft tissue swelling is noted laterally. No acute fracture or dislocation is seen. No gross bony abnormality is noted. IMPRESSION: Soft tissue swelling consistent with the injury. No acute bony abnormality noted. Electronically Signed   By: Alcide Clever M.D.   On: 10-25-2015 22:57     Scheduled Meds: . enoxaparin (LOVENOX) injection  40 mg Subcutaneous Q24H  . sodium chloride flush  3 mL Intravenous Q12H  . vancomycin  1,000 mg Intravenous Q12H   Continuous Infusions:   Principal Problem:   Cellulitis Active Problems:   Hypertension   Cellulitis of left lower extremity   Prediabetes   Time spent:    Standley Dakins, MD, FAAFP [facility] Pager [phone]  If 7PM-7AM, please contact night-coverage [email or web address] Password [email or web address] 10/03/2015, 6:07 PM    LOS: 1 day

## 2015-10-03 NOTE — Progress Notes (Signed)
Pt will need assistance with meds at discharge per case manager. Please let case management know when pt has discharge order

## 2015-10-04 LAB — VANCOMYCIN, TROUGH: VANCOMYCIN TR: 5 ug/mL — AB (ref 10.0–20.0)

## 2015-10-04 MED ORDER — VANCOMYCIN HCL 500 MG IV SOLR
500.0000 mg | Freq: Once | INTRAVENOUS | Status: DC
  Filled 2015-10-04: qty 500

## 2015-10-04 MED ORDER — OXYCODONE-ACETAMINOPHEN 7.5-325 MG PO TABS
1.0000 | ORAL_TABLET | Freq: Once | ORAL | Status: AC | PRN
  Administered 2015-10-04: 1 via ORAL
  Filled 2015-10-04: qty 1

## 2015-10-04 MED ORDER — OXYCODONE-ACETAMINOPHEN 5-325 MG PO TABS: 1.0000 | ORAL_TABLET | Freq: Four times a day (QID) | ORAL | Status: DC | PRN

## 2015-10-04 MED ORDER — VANCOMYCIN HCL 10 G IV SOLR
1500.0000 mg | Freq: Three times a day (TID) | INTRAVENOUS | Status: DC
  Filled 2015-10-04: qty 1500

## 2015-10-04 MED ORDER — SULFAMETHOXAZOLE-TRIMETHOPRIM 800-160 MG PO TABS: 1.0000 | ORAL_TABLET | Freq: Two times a day (BID) | ORAL | Status: DC

## 2015-10-04 NOTE — Discharge Instructions (Signed)
Cellulitis Cellulitis is an infection of the skin and the tissue beneath it. The infected area is usually red and tender. Cellulitis occurs most often in the arms and lower legs.  CAUSES  Cellulitis is caused by bacteria that enter the skin through cracks or cuts in the skin. The most common types of bacteria that cause cellulitis are staphylococci and streptococci. SIGNS AND SYMPTOMS   Redness and warmth.  Swelling.  Tenderness or pain.  Fever. DIAGNOSIS  Your health care provider can usually determine what is wrong based on a physical exam. Blood tests may also be done. TREATMENT  Treatment usually involves taking an antibiotic medicine. HOME CARE INSTRUCTIONS   Take your antibiotic medicine as directed by your health care provider. Finish the antibiotic even if you start to feel better.  Keep the infected arm or leg elevated to reduce swelling.  Apply a warm cloth to the affected area up to 4 times per day to relieve pain.  Take medicines only as directed by your health care provider.  Keep all follow-up visits as directed by your health care provider. SEEK MEDICAL CARE IF:   You notice red streaks coming from the infected area.  Your red area gets larger or turns dark in color.  Your bone or joint underneath the infected area becomes painful after the skin has healed.  Your infection returns in the same area or another area.  You notice a swollen bump in the infected area.  You develop new symptoms.  You have a fever. SEEK IMMEDIATE MEDICAL CARE IF:   You feel very sleepy.  You develop vomiting or diarrhea.  You have a general ill feeling (malaise) with muscle aches and pains.   This information is not intended to replace advice given to you by your health care provider. Make sure you discuss any questions you have with your health care provider.   Document Released: 01/20/2005 Document Revised: 01/01/2015 Document Reviewed: 06/28/2011 Elsevier Interactive  Patient Education 2016 Elsevier Inc.  Cellulitis Cellulitis is an infection of the skin and the tissue under the skin. The infected area is usually red and tender. This happens most often in the arms and lower legs. HOME CARE   Take your antibiotic medicine as told. Finish the medicine even if you start to feel better.  Keep the infected arm or leg raised (elevated).  Put a warm cloth on the area up to 4 times per day.  Only take medicines as told by your doctor.  Keep all doctor visits as told. GET HELP IF:  You see red streaks on the skin coming from the infected area.  Your red area gets bigger or turns a dark color.  Your bone or joint under the infected area is painful after the skin heals.  Your infection comes back in the same area or different area.  You have a puffy (swollen) bump in the infected area.  You have new symptoms.  You have a fever. GET HELP RIGHT AWAY IF:   You feel very sleepy.  You throw up (vomit) or have watery poop (diarrhea).  You feel sick and have muscle aches and pains.   This information is not intended to replace advice given to you by your health care provider. Make sure you discuss any questions you have with your health care provider.   Document Released: 09/29/2007 Document Revised: 01/01/2015 Document Reviewed: 06/28/2011 Elsevier Interactive Patient Education [employer] Elsevier Inc.

## 2015-10-04 NOTE — Progress Notes (Deleted)
HOSPITAL DISCHARGE SUMMARY   @n   Ryan Moss, [place]35 y[place] DOB 03/02/1981  Admission date: 10/01/2015 Discharge Date: 10/04/2015  Primary MD No primary care provider on file.  Admitting Physician Haydee SalterPhillip M Hobbs, MD  Admission Diagnosis  Cellulitis of left lower extremity [L03.116]  Discharge Diagnoses:   Active Hospital Problems   Diagnosis Date Noted  . Cellulitis 10/02/2015  . Prediabetes 10/03/2015  . Hypertension   . Cellulitis of left lower extremity     Resolved Hospital Problems   Diagnosis Date Noted Date Resolved  No resolved problems to display.    Past Medical History  Diagnosis Date  . Hypertension     History reviewed. No pertinent past surgical history.  Hospital Course See H&P, Labs, Consult and Test reports for all details. In brief, this patient was admitted for  Ryan Moss is a 35 y.o. male with medical history significant of 35 year old male with no sniff can past medical history went to the emergency room for worsening cellulitis. Patient states that he went to the ED 2 days ago after he been bitten by insect the day before while on a shed. Redness initially started on the shin with small circular. This spread to his foot his foot became red and swollen. Now stable to the point that patient cannot walk and bear weight on left side. Patient denies any fluctuance or area patient denies any temperature surgery. Patient has no history of IV drug use. Patient has no history of MRSA.  Denies fevers chills nausea vomiting cough wheezing syncope presyncope headache blurry vision double vision trouble hearing diarrhea constipation dysuria hematuria.  Patient when he presented to the emergency room today appeared to be in mild to moderate illness distress. He was tachycardic. He responded rapidly to vancomycin and IV hydration. ED provider than contact the hospitalist service for admission.  Pt was treated with IV vancomycin and had significant improvement, will  be discharged on BActrim DS and follow up with [facility].    Active Hospital Problems   Diagnosis Date Noted  . Cellulitis 10/02/2015  . Prediabetes 10/03/2015  . Hypertension   . Cellulitis of left lower extremity     Resolved Hospital Problems   Diagnosis Date Noted Date Resolved  No resolved problems to display.    Today's Assessment:   Subjective:   Ryan Moss  Pt reports that he feels much better, less swelling and pain.   Objective:   Blood pressure 128/79, pulse 75, temperature 97.5 F (36.4 C), temperature source Oral, resp. rate 19, height 5\' 8"  (1.727 m), weight 175 lb 11.2 oz (79.697 kg), SpO2 100 %.  Intake/Output Summary (Last 24 hours) at 10/04/15 1050 Last data filed at 10/04/15 0835  Gross per 24 hour  Intake    480 ml  Output    250 ml  Net    230 ml    Exam General exam: awake, alert no distress Respiratory system: Clear. No increased work of breathing. Cardiovascular system: S1 & S2 heard, RRR. No JVD, murmurs, gallops, clicks or pedal edema. Gastrointestinal system: Abdomen is nondistended, soft and nontender. Normal bowel sounds heard. Central nervous system: Alert and oriented. No focal neurological deficits. Extremities: LLE overall improving, redness resolved, no edema, tenderness improving.    Lab Results  Component Value Date   WBC 9.1 10/02/2015   HGB 11.6* 10/02/2015   HCT 35.9* 10/02/2015   PLT 226 10/02/2015   LYMPHOPCT 24 10/01/2015   MONOPCT 14 10/01/2015   EOSPCT 2 10/01/2015  BASOPCT 0 10/01/2015   CMP: Lab Results  Component Value Date   NA 139 10/03/2015   K 3.8 10/03/2015   CL 107 10/03/2015   CO2 26 10/03/2015   BUN 7 10/03/2015   CREATININE 0.87 10/03/2015   PROT 7.3 10/01/2015   ALBUMIN 4.0 10/01/2015   BILITOT 1.4* 10/01/2015   ALKPHOS 80 10/01/2015   AST 34 10/01/2015   ALT 30 10/01/2015  . DISCHARGE MEDICATION:   Medication List    STOP taking these medications        doxycycline  100 MG capsule  Commonly known as:  VIBRAMYCIN     hydrocortisone 2.5 % lotion     PRESCRIPTION MEDICATION      TAKE these medications        oxyCODONE-acetaminophen 5-325 MG tablet  Commonly known as:  PERCOCET/ROXICET  Take 1 tablet by mouth every 6 (six) hours as needed for severe pain.     sulfamethoxazole-trimethoprim 800-160 MG tablet  Commonly known as:  BACTRIM DS,SEPTRA DS  Take 1 tablet by mouth 2 (two) times daily.       Disposition and Follow-up:      Discharge Instructions    Diet - low sodium heart healthy    Complete by:  As directed      Increase activity slowly    Complete by:  As directed           Follow-up Information    Follow up with [facility]. Schedule an appointment as soon as possible for a visit in 1 week.   Why:  hospital followup    Contact information:   [address] [place] [address] [phone]     The risks, benefits, and possible side effects of all treatments and tests were explained to the patient.  The patient verbalized understanding.  The importance of close follow up with the primary care medical provider was explained clearly to the patient.  The patient verbalized understanding.  The patient was given instructions to return if symptoms recur, worsen or new changes develop.  The patient verbalized understanding.   Cleora Fleet, MD, CDE, FAAFP [facility] [place], [place]   Signed: Standley Dakins MD 10/04/2015 10:50 AM

## 2015-10-04 NOTE — Discharge Summary (Signed)
HOSPITAL DISCHARGE SUMMARY     Ryan Moss, [age] y.o., DOB March 18, 1981  Admission date: 10/01/2015 Discharge Date: 10/04/2015  Primary MD No primary care provider on file.  Admitting Physician Haydee Salter, MD  Admission Diagnosis  Cellulitis of left lower extremity  Discharge Diagnoses:   Active Hospital Problems   Diagnosis Date Noted  . Cellulitis 10/02/2015  . Prediabetes 10/03/2015  . Hypertension   . Cellulitis of left lower extremity     Resolved Hospital Problems   Diagnosis Date Noted Date Resolved  No resolved problems to display.    Past Medical History  Diagnosis Date  . Hypertension     History reviewed. No pertinent past surgical history.  Hospital Course See H&P, Labs, Consult and Test reports for all details. In brief, this patient was admitted for  Ryan Moss is a 35 y.o. male with medical history significant of 35 year old male with no sniff can past medical history went to the emergency room for worsening cellulitis. Patient states that he went to the ED 2 days ago after he been bitten by insect the day before while on a shed. Redness initially started on the shin with small circular. This spread to his foot his foot became red and swollen. Now stable to the point that patient cannot walk and bear weight on left side. Patient denies any fluctuance or area patient denies any temperature surgery. Patient has no history of IV drug use. Patient has no history of MRSA.  Denies fevers chills nausea vomiting cough wheezing syncope presyncope headache blurry vision double vision trouble hearing diarrhea constipation dysuria hematuria.  Patient when he presented to the emergency room today appeared to be in mild to moderate illness distress. He was tachycardic. He responded rapidly to vancomycin and IV hydration. ED provider than contact the hospitalist service for admission.  Pt was treated with IV vancomycin and had significant improvement, will be  discharged on BActrim DS and follow up with [facility].    Active Hospital Problems   Diagnosis Date Noted  . Cellulitis 10/02/2015  . Prediabetes 10/03/2015  . Hypertension   . Cellulitis of left lower extremity     Resolved Hospital Problems   Diagnosis Date Noted Date Resolved  No resolved problems to display.    Today's Assessment:   Subjective:   Ryan Moss  Pt reports that he feels much better, less swelling and pain.   Objective:   Blood pressure 128/79, pulse 75, temperature 97.5 F (36.4 C), temperature source Oral, resp. rate 19, height  (1.727 m), weight 175 lb 11.2 oz (79.697 kg), SpO2 100 %.  Intake/Output Summary (Last 24 hours) at 10/04/15 1050 Last data filed at 10/04/15 0835  Gross per 24 hour  Intake    480 ml  Output    250 ml  Net    230 ml    Exam General exam: awake, alert no distress Respiratory system: Clear. No increased work of breathing. Cardiovascular system: S1 & S2 heard, RRR. No JVD, murmurs, gallops, clicks or pedal edema. Gastrointestinal system: Abdomen is nondistended, soft and nontender. Normal bowel sounds heard. Central nervous system: Alert and oriented. No focal neurological deficits. Extremities: LLE overall improving, redness resolved, no edema, tenderness improving.    Lab Results  Component Value Date   WBC 9.1 10/02/2015   HGB 11.6* 10/02/2015   HCT 35.9* 10/02/2015   PLT 226 10/02/2015   LYMPHOPCT 24 10/01/2015   MONOPCT 14 10/01/2015   EOSPCT 2 10/01/2015  BASOPCT 0 10/01/2015   CMP: Lab Results  Component Value Date   NA 139 10/03/2015   K 3.8 10/03/2015   CL 107 10/03/2015   CO2 26 10/03/2015   BUN 7 10/03/2015   CREATININE 0.87 10/03/2015   PROT 7.3 10/01/2015   ALBUMIN 4.0 10/01/2015   BILITOT 1.4* 10/01/2015   ALKPHOS 80 10/01/2015   AST 34 10/01/2015   ALT 30 10/01/2015  . DISCHARGE MEDICATION:   Medication List    STOP taking these medications        doxycycline 100  MG capsule  Commonly known as:  VIBRAMYCIN     hydrocortisone 2.5 % lotion     PRESCRIPTION MEDICATION      TAKE these medications        oxyCODONE-acetaminophen 5-325 MG tablet  Commonly known as:  PERCOCET/ROXICET  Take 1 tablet by mouth every 6 (six) hours as needed for severe pain.     sulfamethoxazole-trimethoprim 800-160 MG tablet  Commonly known as:  BACTRIM DS,SEPTRA DS  Take 1 tablet by mouth 2 (two) times daily.       Disposition and Follow-up:      Discharge Instructions    Diet - low sodium heart healthy    Complete by:  As directed      Increase activity slowly    Complete by:  As directed           Follow-up Information    Follow up with [facility]. Schedule an appointment as soon as possible for a visit in 1 week.   Why:  hospital followup    Contact information:   [address] [place] [phone]     The risks, benefits, and possible side effects of all treatments and tests were explained to the patient.  The patient verbalized understanding.  The importance of close follow up with the primary care medical provider was explained clearly to the patient.  The patient verbalized understanding.  The patient was given instructions to return if symptoms recur, worsen or new changes develop.  The patient verbalized understanding.   Cleora Fleetlanford L. Faizan Geraci, MD, CDE, FAAFP [facility] [place], [place]   Signed: Standley Dakinslanford Crystalina Stodghill MD 10/04/2015 10:50 AM

## 2015-10-07 LAB — CULTURE, BLOOD (ROUTINE X 2)
CULTURE: NO GROWTH
Culture: NO GROWTH

## 2016-10-19 ENCOUNTER — Emergency Department (HOSPITAL_COMMUNITY)
Admission: EM | Admit: 2016-10-19 | Discharge: 2016-10-20 | Disposition: A | Discharge status: Home / Self Care | Payer: Self-pay | Attending: Emergency Medicine | Admitting: Emergency Medicine

## 2016-10-19 ENCOUNTER — Encounter (HOSPITAL_COMMUNITY): Payer: Self-pay | Admitting: *Deleted

## 2016-10-19 ENCOUNTER — Emergency Department (HOSPITAL_COMMUNITY): Payer: Self-pay

## 2016-10-19 DIAGNOSIS — M25571 Pain in right ankle and joints of right foot: Secondary | ICD-10-CM | POA: Insufficient documentation

## 2016-10-19 DIAGNOSIS — Z79899 Other long term (current) drug therapy: Secondary | ICD-10-CM | POA: Insufficient documentation

## 2016-10-19 DIAGNOSIS — I10 Essential (primary) hypertension: Secondary | ICD-10-CM | POA: Insufficient documentation

## 2016-10-19 DIAGNOSIS — F17200 Nicotine dependence, unspecified, uncomplicated: Secondary | ICD-10-CM | POA: Insufficient documentation

## 2016-10-19 MED ORDER — IBUPROFEN 800 MG PO TABS
800.0000 mg | ORAL_TABLET | Freq: Once | ORAL | Status: AC
  Administered 2016-10-19: 800 mg via ORAL
  Filled 2016-10-19: qty 1

## 2016-10-19 MED ORDER — OXYCODONE-ACETAMINOPHEN 5-325 MG PO TABS: 1.0000 | ORAL_TABLET | ORAL | 0 refills | Status: DC | PRN

## 2016-10-19 MED ORDER — NAPROXEN 500 MG PO TABS: 500.0000 mg | ORAL_TABLET | Freq: Two times a day (BID) | ORAL | 0 refills | Status: DC

## 2016-10-19 MED ORDER — PREDNISONE 20 MG PO TABS
60.0000 mg | ORAL_TABLET | Freq: Once | ORAL | Status: AC
  Administered 2016-10-19: 60 mg via ORAL
  Filled 2016-10-19: qty 3

## 2016-10-19 MED ORDER — DOXYCYCLINE HYCLATE 100 MG PO TABS
100.0000 mg | ORAL_TABLET | Freq: Once | ORAL | Status: AC
  Administered 2016-10-19: 100 mg via ORAL
  Filled 2016-10-19: qty 1

## 2016-10-19 MED ORDER — DOXYCYCLINE HYCLATE 100 MG PO CAPS: 100.0000 mg | ORAL_CAPSULE | Freq: Two times a day (BID) | ORAL | 0 refills | Status: DC

## 2016-10-19 NOTE — ED Notes (Signed)
Pt verbalized understanding of discharge instructions.

## 2016-10-19 NOTE — ED Triage Notes (Signed)
Pt complains of right ankle and foot pain and swelling since getting off of work today. Pt has limited ROM in ankle. Pt denies injury to ankle, states he believes he was bitten by a spider.

## 2016-10-19 NOTE — Discharge Instructions (Signed)
Contact a health care provider if: °You have a fever. °Your symptoms do not improve within 1-2 days of starting treatment. °Your bone or joint underneath the infected area becomes painful after the skin has healed. °Your infection returns in the same area or another area. °You notice a swollen bump in the infected area. °You develop new symptoms. °You have a general ill feeling (malaise) with muscle aches and pains. °Get help right away if: °Your symptoms get worse. °You feel very sleepy. °You develop vomiting or diarrhea that persists. °You notice red streaks coming from the infected area. °Your red area gets larger or turns dark in color. °

## 2016-10-19 NOTE — ED Provider Notes (Signed)
[facility]-EMERGENCY DEPT Provider Note   CSN: [id number] Arrival date & time: 10/19/16  2040     History   Chief Complaint Chief Complaint  Patient presents with  . Ankle Pain    HPI  Ryan Moss is a 36 y.o. male who presents emergency by with chief complaint of right ankle pain. Patient states that when he got home from work, he started having pain in the right ankle. It progressively and rapidly worsened. He has exquisite pain to palpation of the tissues and has exquisite pain when trying to ambulate on the ankle. He denies any history of gout. He denies heavy drinking. He does take hydrochlorothiazide for hypertension. He is a previous diagnosis of cellulitis of the lower extremity but denies fevers, chills, or injuries to the ankle   Ankle Pain      Past Medical History:  Diagnosis Date  . Hypertension     Patient Active Problem List   Diagnosis Date Noted  . Prediabetes 10/03/2015  . Cellulitis 10/02/2015  . Hypertension   . Cellulitis of left lower extremity     History reviewed. No pertinent surgical history.     Home Medications    Prior to Admission medications   Medication Sig Start Date End Date Taking? Authorizing Provider  oxyCODONE-acetaminophen (PERCOCET/ROXICET) 5-325 MG tablet Take 1 tablet by mouth every 6 (six) hours as needed for severe pain. 10/04/15   Johnson, Clanford L, MD  sulfamethoxazole-trimethoprim (BACTRIM DS,SEPTRA DS) 800-160 MG tablet Take 1 tablet by mouth 2 (two) times daily. 10/04/15   Cleora Fleet, MD    Family History Family History  Problem Relation Age of Onset  . Hypertension Mother     Social History Social History  Substance Use Topics  . Smoking status: Current Every Day Smoker    Packs/day: 1.00  . Smokeless tobacco: Never Used  . Alcohol use No     Allergies   Patient has no known allergies.   Review of Systems Review of Systems Ten systems reviewed and are negative for acute change, except as  noted in the HPI.    Physical Exam Updated Vital Signs BP (!) 139/107 (BP Location: Right Arm)   Pulse 83   Temp 98 F (36.7 C)   Resp 18   SpO2 95%   Physical Exam  Constitutional: He appears well-developed and well-nourished. No distress.  HENT:  Head: Normocephalic and atraumatic.  Eyes: Conjunctivae are normal. No scleral icterus.  Neck: Normal range of motion. Neck supple.  Cardiovascular: Normal rate, regular rhythm and normal heart sounds.   Pulmonary/Chest: Effort normal and breath sounds normal. No respiratory distress.  Abdominal: Soft. There is no tenderness.  Musculoskeletal: He exhibits no edema.  R ankle with swelling, warmth and erythema to the medial foot and ankle . TTP. Normal pt and DP pulses BL. Able to move the joint.  Neurological: He is alert.  Skin: Skin is warm and dry. He is not diaphoretic.  Psychiatric: His behavior is normal.  Nursing note and vitals reviewed.    ED Treatments / Results  Labs (all labs ordered are listed, but only abnormal results are displayed) Labs Reviewed - No data to display  EKG  EKG Interpretation None       Radiology No results found.  Procedures Procedures (including critical care time)  Medications Ordered in ED Medications - No data to display   Initial Impression / Assessment and Plan / ED Course  I have reviewed the triage vital signs and  the nursing notes.  Pertinent labs & imaging results that were available during my care of the patient were reviewed by me and considered in my medical decision making (see chart for details).      patient with gout vs cellulits.  Will treat for both with nsaids, abx and pain meds.  Given crutches. I doubt septic joint or DVT as there is no calf swelling or pain. Patient advised to return for fevers, worsening redness and swelling, uncontrolled pain.  Final Clinical Impressions(s) / ED Diagnoses   Final diagnoses:  Acute right ankle pain  Hypertension,  unspecified type    New Prescriptions New Prescriptions   No medications on file     Arthor CaptainHarris, Charliee Krenz, PA-C 10/20/16 0026    Loren RacerYelverton, David, MD 10/22/16 [phone]

## 2017-10-29 IMAGING — CR DG ANKLE COMPLETE 3+V*R*
4 series · 4 of 4 positions shown · non-contrast
Comparison: None.

CLINICAL DATA: Right ankle pain

EXAM:
RIGHT ANKLE - COMPLETE 3+ VIEW

[x ankle ap right]
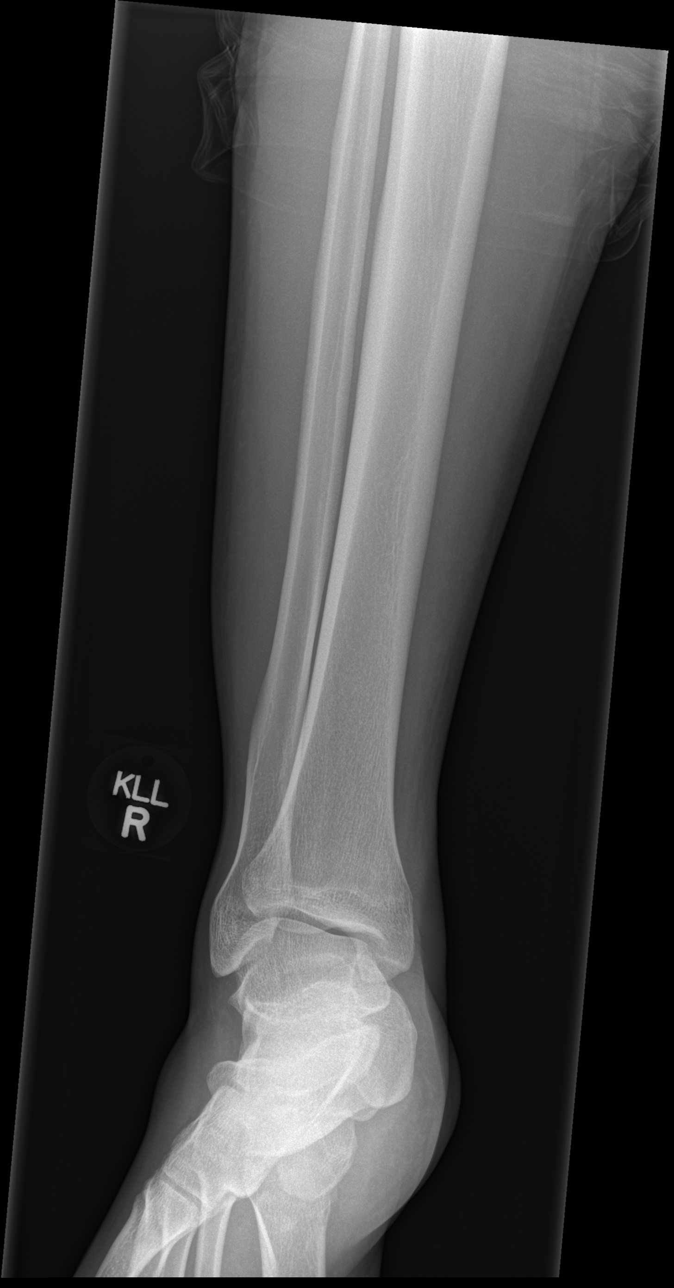

[x ankle lat right (1 of 2)]
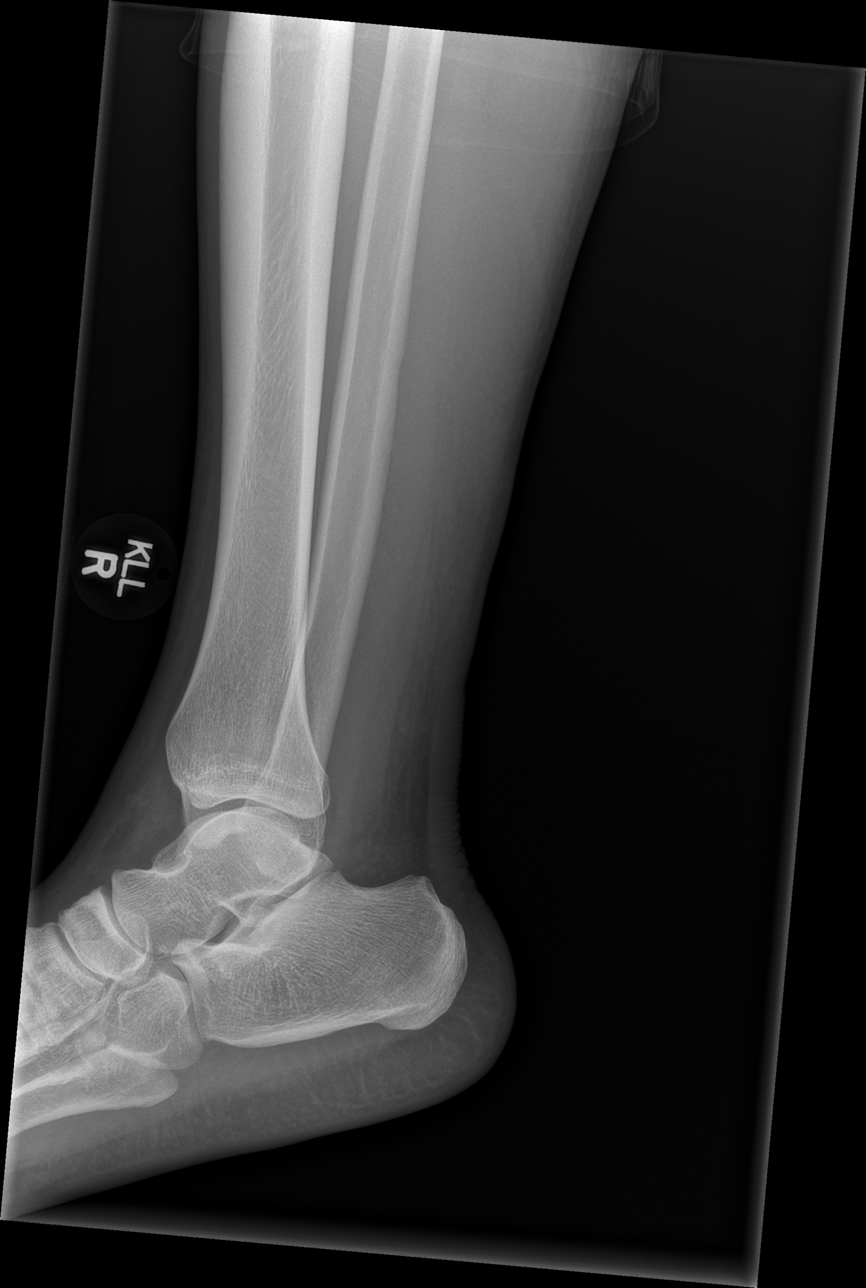

[x ankle lat right (2 of 2)]
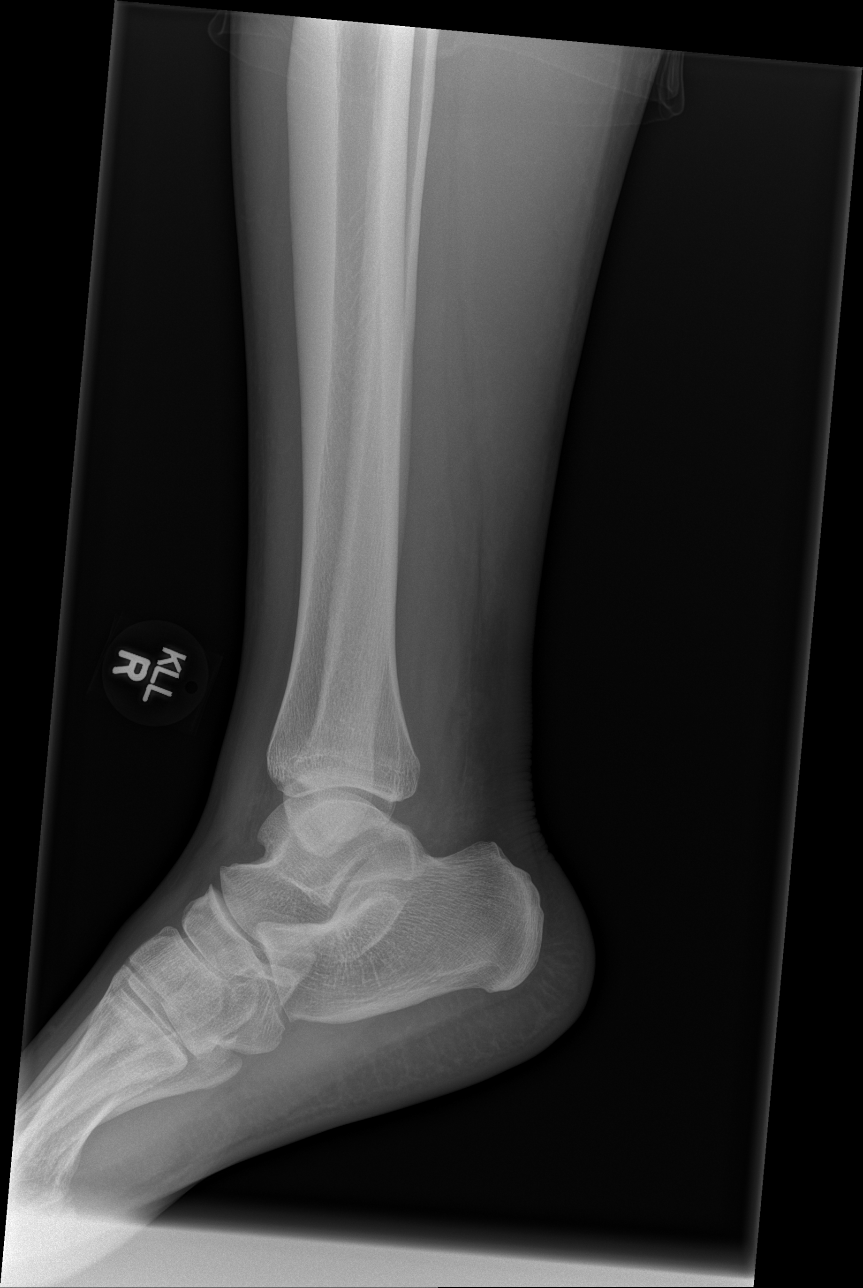

[x ankle obl right]
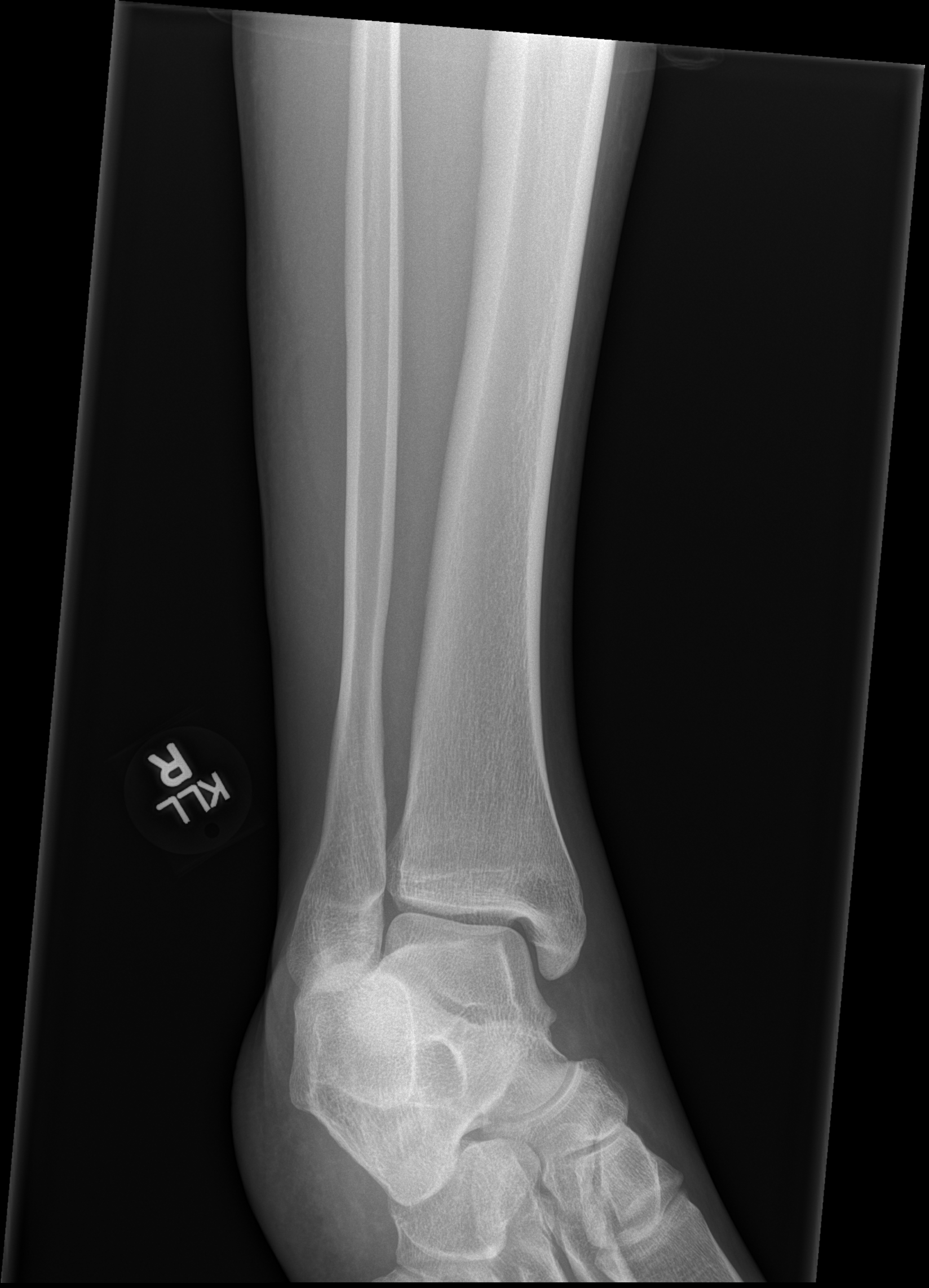

[4 of 4 positions shown; findings below may reference images not displayed]

FINDINGS: No fracture of the right ankle. Ankle mortise is approximated. No
ankle effusion. Assessment for subtle alignment abnormalities is
limited by difficulties with patient positioning.
IMPRESSION: No acute osseous abnormality of the right ankle.

## 2021-10-30 ENCOUNTER — Emergency Department (HOSPITAL_COMMUNITY)
Admission: EM | Admit: 2021-10-30 | Discharge: 2021-10-31 | Disposition: A | Discharge status: Home / Self Care | Payer: Self-pay | Attending: Emergency Medicine | Admitting: Emergency Medicine

## 2021-10-30 ENCOUNTER — Emergency Department (HOSPITAL_COMMUNITY): Payer: Self-pay

## 2021-10-30 ENCOUNTER — Encounter (HOSPITAL_COMMUNITY): Payer: Self-pay

## 2021-10-30 DIAGNOSIS — M545 Low back pain, unspecified: Secondary | ICD-10-CM | POA: Insufficient documentation

## 2021-10-30 DIAGNOSIS — R0789 Other chest pain: Secondary | ICD-10-CM | POA: Insufficient documentation

## 2021-10-30 LAB — CBC WITH DIFFERENTIAL/PLATELET
Abs Immature Granulocytes: 0.02 10*3/uL (ref 0.00–0.07)
Basophils Absolute: 0 10*3/uL (ref 0.0–0.1)
Basophils Relative: 1 %
Eosinophils Absolute: 0.4 10*3/uL (ref 0.0–0.5)
Eosinophils Relative: 5 %
HCT: 48.6 % (ref 39.0–52.0)
Hemoglobin: 15.7 g/dL (ref 13.0–17.0)
Immature Granulocytes: 0 %
Lymphocytes Relative: 25 %
Lymphs Abs: 2 10*3/uL (ref 0.7–4.0)
MCH: 28.2 pg (ref 26.0–34.0)
MCHC: 32.3 g/dL (ref 30.0–36.0)
MCV: 87.3 fL (ref 80.0–100.0)
Monocytes Absolute: 1.1 10*3/uL — ABNORMAL HIGH (ref 0.1–1.0)
Monocytes Relative: 14 %
Neutro Abs: 4.4 10*3/uL (ref 1.7–7.7)
Neutrophils Relative %: 55 %
Platelets: 230 10*3/uL (ref 150–400)
RBC: 5.57 MIL/uL (ref 4.22–5.81)
RDW: 14.8 % (ref 11.5–15.5)
WBC: 8 10*3/uL (ref 4.0–10.5)
nRBC: 0 % (ref 0.0–0.2)

## 2021-10-30 LAB — COMPREHENSIVE METABOLIC PANEL
ALT: 28 U/L (ref 0–44)
AST: 25 U/L (ref 15–41)
Albumin: 3.8 g/dL (ref 3.5–5.0)
Alkaline Phosphatase: 78 U/L (ref 38–126)
Anion gap: 7 (ref 5–15)
BUN: 13 mg/dL (ref 6–20)
CO2: 25 mmol/L (ref 22–32)
Calcium: 9.1 mg/dL (ref 8.9–10.3)
Chloride: 107 mmol/L (ref 98–111)
Creatinine, Ser: 0.93 mg/dL (ref 0.61–1.24)
GFR, Estimated: >60 mL/min (ref >60)
Glucose, Bld: 139 mg/dL — ABNORMAL HIGH (ref 70–99)
Potassium: 3.7 mmol/L (ref 3.5–5.1)
Sodium: 139 mmol/L (ref 135–145)
Total Bilirubin: 0.5 mg/dL (ref 0.3–1.2)
Total Protein: 7.6 g/dL (ref 6.5–8.1)

## 2021-10-30 LAB — TROPONIN I (HIGH SENSITIVITY): Troponin I (High Sensitivity): 6 ng/L (ref <18)

## 2021-10-30 LAB — LIPASE, BLOOD: Lipase: 42 U/L (ref 11–51)

## 2021-10-30 NOTE — ED Notes (Signed)
Aware of need for urine sample, urinal at bedside and beverage provided

## 2021-10-30 NOTE — ED Triage Notes (Signed)
Pt states he has been having ongoing CP and SOB for 11 months. Also has started having R flank pain in the last 3-4 days. Denies urinary issues.

## 2021-10-30 NOTE — ED Provider Triage Note (Signed)
Emergency Medicine Provider Triage Evaluation Note  Ryan Moss , a 41 y.o. male  was evaluated in triage.  Pt complains of dizziness, chest pain, right flank pain.  States has been going on for months however gradually worsening.  He denies any prior history of AAA or dissections.  He feels like he is short of breath all the time.  No cough.  No dysuria or hematuria.  States he has had "issues with my kidneys" previously however unsure what this entailed.  Patient states when he turns his head he feels dizzy.  Review of Systems  Positive: Chest pain, shortness of breath, right flank pain, dizziness Negative:   Physical Exam  BP (!) 167/99 (BP Location: Right Arm)   Pulse 90   Temp 98.1 F (36.7 C) (Oral)   Resp 16   SpO2 100%  Gen:   Awake, no distress   Resp:  Normal effort  MSK:   Moves extremities without difficulty  Other:    Medical Decision Making  Medically screening exam initiated at 4:03 PM.  Appropriate orders placed.  Ryan Moss was informed that the remainder of the evaluation will be completed by another provider, this initial triage assessment does not replace that evaluation, and the importance of remaining in the ED until their evaluation is complete.  CP, SOB, flank pain x months   Ryan Moss A, PA-C 10/30/21 1604

## 2021-10-30 NOTE — ED Provider Notes (Signed)
[facility]-EMERGENCY DEPT  Provider Note  CSN: [id number] Arrival date & time: 10/30/21 1546  History Chief Complaint  Patient presents with   Chest Pain   Flank Pain    Ryan Moss is a 41 y.o. male with no known medical problems, does not have a doctor. Here primarily for R flank/lower back pain, constant for 4 days, worse with movement. Non radiating. Not associated fever, cough, dysuria or hematuria. He also wants to get checked out for chest pains he has off and on for about a year. Last episode was 2 days ago.    Home Medications Prior to Admission medications   Medication Sig Start Date End Date Taking? Authorizing Provider  doxycycline (VIBRAMYCIN) 100 MG capsule Take 1 capsule (100 mg total) by mouth 2 (two) times daily. 10/19/16   Harris, Cammy Copa, PA-C  naproxen (NAPROSYN) 500 MG tablet Take 1 tablet (500 mg total) by mouth 2 (two) times daily. 10/19/16   Arthor Captain, PA-C  oxyCODONE-acetaminophen (PERCOCET) 5-325 MG tablet Take 1-2 tablets by mouth every 4 (four) hours as needed. 10/19/16   Arthor Captain, PA-C  sulfamethoxazole-trimethoprim (BACTRIM DS,SEPTRA DS) 800-160 MG tablet Take 1 tablet by mouth 2 (two) times daily. 10/04/15   Cleora Fleet, MD     Allergies    Patient has no known allergies.   Review of Systems   Review of Systems Please see HPI for pertinent positives and negatives  Physical Exam BP (!) 176/118   Pulse 88   Temp 98.1 F (36.7 C) (Oral)   Resp 19   SpO2 100%   Physical Exam Vitals and nursing note reviewed.  Constitutional:      Appearance: Normal appearance.  HENT:     Head: Normocephalic and atraumatic.     Nose: Nose normal.     Mouth/Throat:     Mouth: Mucous membranes are moist.  Eyes:     Extraocular Movements: Extraocular movements intact.     Conjunctiva/sclera: Conjunctivae normal.  Cardiovascular:     Rate and Rhythm: Normal rate.  Pulmonary:     Effort: Pulmonary effort is normal.      Breath sounds: Normal breath sounds.  Abdominal:     General: Abdomen is flat.     Palpations: Abdomen is soft.     Tenderness: There is no abdominal tenderness.  Musculoskeletal:        General: No swelling. Normal range of motion.     Cervical back: Neck supple.     Comments: Tenderness in R lumbar paraspinal muscles  Skin:    General: Skin is warm and dry.  Neurological:     General: No focal deficit present.     Mental Status: He is alert.  Psychiatric:        Mood and Affect: Mood normal.     ED Results / Procedures / Treatments   EKG None  Procedures Procedures  Medications Ordered in the ED Medications - No data to display  Initial Impression and Plan  Patient here with likely MSK low back pain. He also reported intermittent chest pain for about a year. Labs done in triage include normal CBC, BMP, lipase and initial Trop. He had a second trop sent although given his symptom course, no concern for ACS. He has not had a urine collected yet. I personally viewed the images from radiology studies and agree with radiologist interpretation: AAS is neg. Awaiting UA but if negative plan discharge with outpatient follow up. NSAIDs for back  pain, PCP referral.    ED Course       MDM Rules/Calculators/A&P Medical Decision Making   Final Clinical Impression(s) / ED Diagnoses Final diagnoses:  None    Rx / DC Orders ED Discharge Orders     None

## 2021-10-31 LAB — URINALYSIS, ROUTINE W REFLEX MICROSCOPIC
Bacteria, UA: NONE SEEN
Bilirubin Urine: NEGATIVE
Glucose, UA: NEGATIVE mg/dL
Hgb urine dipstick: NEGATIVE
Ketones, ur: NEGATIVE mg/dL
Leukocytes,Ua: NEGATIVE
Nitrite: NEGATIVE
Protein, ur: 30 mg/dL — AB
Specific Gravity, Urine: 1.02 (ref 1.005–1.030)
pH: 7 (ref 5.0–8.0)

## 2021-10-31 LAB — TROPONIN I (HIGH SENSITIVITY): Troponin I (High Sensitivity): 7 ng/L (ref <18)

## 2021-10-31 MED ORDER — NAPROXEN 500 MG PO TABS: 500.0000 mg | ORAL_TABLET | Freq: Two times a day (BID) | ORAL | 0 refills | Status: AC

## 2021-10-31 MED ORDER — KETOROLAC TROMETHAMINE 15 MG/ML IJ SOLN
30.0000 mg | Freq: Once | INTRAMUSCULAR | Status: AC
  Administered 2021-10-31: 30 mg via INTRAMUSCULAR
  Filled 2021-10-31: qty 2

## 2021-10-31 MED ORDER — CYCLOBENZAPRINE HCL 10 MG PO TABS: 10.0000 mg | ORAL_TABLET | Freq: Two times a day (BID) | ORAL | 0 refills | Status: AC | PRN

## 2021-11-25 ENCOUNTER — Emergency Department (HOSPITAL_COMMUNITY): Payer: Self-pay

## 2021-11-25 ENCOUNTER — Emergency Department (HOSPITAL_COMMUNITY)
Admission: EM | Admit: 2021-11-25 | Discharge: 2021-11-25 | Disposition: A | Discharge status: Home / Self Care | Payer: Self-pay | Attending: Emergency Medicine | Admitting: Emergency Medicine

## 2021-11-25 ENCOUNTER — Other Ambulatory Visit: Payer: Self-pay

## 2021-11-25 DIAGNOSIS — R079 Chest pain, unspecified: Secondary | ICD-10-CM | POA: Insufficient documentation

## 2021-11-25 DIAGNOSIS — D72829 Elevated white blood cell count, unspecified: Secondary | ICD-10-CM | POA: Insufficient documentation

## 2021-11-25 DIAGNOSIS — R42 Dizziness and giddiness: Secondary | ICD-10-CM | POA: Insufficient documentation

## 2021-11-25 DIAGNOSIS — I10 Essential (primary) hypertension: Secondary | ICD-10-CM | POA: Insufficient documentation

## 2021-11-25 LAB — BASIC METABOLIC PANEL
Anion gap: 11 (ref 5–15)
BUN: 12 mg/dL (ref 6–20)
CO2: 20 mmol/L — ABNORMAL LOW (ref 22–32)
Calcium: 9.3 mg/dL (ref 8.9–10.3)
Chloride: 107 mmol/L (ref 98–111)
Creatinine, Ser: 0.94 mg/dL (ref 0.61–1.24)
GFR, Estimated: >60 mL/min (ref >60)
Glucose, Bld: 179 mg/dL — ABNORMAL HIGH (ref 70–99)
Potassium: 4.1 mmol/L (ref 3.5–5.1)
Sodium: 138 mmol/L (ref 135–145)

## 2021-11-25 LAB — CBC
HCT: 46.6 % (ref 39.0–52.0)
Hemoglobin: 15.4 g/dL (ref 13.0–17.0)
MCH: 28.9 pg (ref 26.0–34.0)
MCHC: 33 g/dL (ref 30.0–36.0)
MCV: 87.4 fL (ref 80.0–100.0)
Platelets: 226 10*3/uL (ref 150–400)
RBC: 5.33 MIL/uL (ref 4.22–5.81)
RDW: 14.1 % (ref 11.5–15.5)
WBC: 11.4 10*3/uL — ABNORMAL HIGH (ref 4.0–10.5)
nRBC: 0 % (ref 0.0–0.2)

## 2021-11-25 LAB — TROPONIN I (HIGH SENSITIVITY)
Troponin I (High Sensitivity): 8 ng/L (ref <18)
Troponin I (High Sensitivity): 10 ng/L (ref <18)

## 2021-11-25 MED ORDER — IOHEXOL 350 MG/ML SOLN
100.0000 mL | Freq: Once | INTRAVENOUS | Status: AC | PRN
  Administered 2021-11-25: 75 mL via INTRAVENOUS

## 2021-11-25 NOTE — ED Notes (Signed)
Patient transported to CT 

## 2021-11-25 NOTE — ED Triage Notes (Signed)
BIB [facility] from home. Ongoing CP and dizziness. -NIH. Aox4. Ambulatory.   HR 96-104 EKG Junctional CBG 166  HX HTN- noncompliant with meds x3 months  324 ASA 0.4 NTG

## 2021-11-25 NOTE — ED Provider Notes (Signed)
[facility]-EMERGENCY DEPT [facility] Emergency Department Provider Note MRN:  [id number]  Arrival date & time: 11/25/21     Chief Complaint   Chest Pain   History of Present Illness   Ryan Moss is a 41 y.o. year-old male presents to the ED with chief complaint of chest pain.  States that he has been having intermittent dizziness and occasional chest pain for the past several months.  States that his symptoms worsen this evening and brought him to the emergency department.  He received 324 of aspirin and 1 sublingual nitroglycerin by EMS.  He states that he feels dizzy when he turns his head to the left, but not to the right.  He denies any vomiting.  Denies shortness of breath.  History provided by patient.   Review of Systems  Pertinent review of systems noted in HPI.    Physical Exam   Vitals:   11/25/21 0140 11/25/21 0345  BP: (!) 176/101 (!) 160/101  Pulse: 98 88  Resp: 18 16  Temp: 98.3 F (36.8 C)   SpO2: 97% 96%    CONSTITUTIONAL:  well-appearing, NAD NEURO:  Alert and oriented x 3, CN 3-12 grossly intact EYES:  eyes equal and reactive, no nystagmus ENT/NECK:  Supple, no stridor  CARDIO: Normal rate, regular rhythm, appears well-perfused  PULM:  No respiratory distress, clear to auscultation bilaterally GI/GU:  non-distended,  MSK/SPINE:  No gross deformities, no edema, moves all extremities  SKIN:  no rash, atraumatic   *Additional and/or pertinent findings included in MDM below  Diagnostic and Interventional Summary    EKG Interpretation  Date/Time:  Wednesday November 25 2021 01:41:08 EDT Ventricular Rate:  97 PR Interval:  103 QRS Duration: 88 QT Interval:  347 QTC Calculation: 441 R Axis:   80 Text Interpretation: Sinus rhythm Short PR interval Consider left ventricular hypertrophy No significant change since last tracing Confirmed by Alvira Monday ([id number]) on 11/25/2021 2:38:52 AM       Labs Reviewed  BASIC METABOLIC PANEL - Abnormal;  Notable for the following components:      Result Value   CO2 20 (*)    Glucose, Bld 179 (*)    All other components within normal limits  CBC - Abnormal; Notable for the following components:   WBC 11.4 (*)    All other components within normal limits  TROPONIN I (HIGH SENSITIVITY)  TROPONIN I (HIGH SENSITIVITY)    CT ANGIO HEAD NECK W WO CM  Final Result    DG Chest Port 1 View  Final Result      Medications  iohexol (OMNIPAQUE) 350 MG/ML injection 100 mL (75 mLs Intravenous Contrast Given 11/25/21 0320)     Procedures  /  Critical Care Procedures  ED Course and Medical Decision Making  I have reviewed the triage vital signs, the nursing notes, and pertinent available records from the EMR.  Social Determinants Affecting Complexity of Care: Patient has no clinically significant social determinants affecting this chief complaint..   ED Course:    Medical Decision Making Patient here with chest pain that started several weeks ago.  He reports having intermittent symptoms.  States that his symptoms worsened tonight.  He also reports some associated dizziness.    We will check CT angio head and neck with and without contrast to look for any stenosis that could be contributing to his dizziness.  Check cardiac enzymes, chest x-ray, and EKG.  Labs and imaging are reassuring.  Patient is noted to be hypertensive.  I do think that he will need close follow-up with cardiology for his chest pain and may benefit from neurology follow-up for his dizziness.  At this time, I do not think that he needs any additional emergent work-up.  Feel that he is stable for discharge.  Return precautions discussed.   Problems Addressed: Chest pain, unspecified type: undiagnosed new problem with uncertain prognosis Dizziness: undiagnosed new problem with uncertain prognosis  Amount and/or Complexity of Data Reviewed Labs: ordered.    Details: Laboratory work-up is notable for mild leukocytosis at  11.4, initial troponin is 8, repeat is 10, doubt ACS.  EKG shows no acute ischemic changes. Radiology: ordered and independent interpretation performed.    Details: CT shows no large intracranial hemorrhage, chest x-ray negative for large opacity  Risk Prescription drug management.     Consultants: No consultations were needed in caring for this patient.   Treatment and Plan: I considered admission due to patient's initial presentation, but after considering the examination and diagnostic results, patient will not require admission and can be discharged with outpatient follow-up.    Final Clinical Impressions(s) / ED Diagnoses     ICD-10-CM   1. Chest pain, unspecified type  R07.9 Ambulatory referral to Cardiology    2. Dizziness  R42       ED Discharge Orders          Ordered    Ambulatory referral to Cardiology       Comments: If you have not heard from the Cardiology office within the next 72 hours please call [phone].   11/25/21 0500    Ambulatory referral to Neurology       Comments: An appointment is requested in approximately: 4 weeks   11/25/21 0501              Discharge Instructions Discussed with and Provided to Patient:   Discharge Instructions   None      Roxy Horseman, PA-C 11/25/21 0315    Alvira Monday, MD 11/25/21 2324

## 2021-11-30 ENCOUNTER — Ambulatory Visit: Payer: Self-pay | Admitting: Internal Medicine

## 2021-11-30 NOTE — Progress Notes (Deleted)
Cardiology Office Note:    Date:  11/30/2021   ID:  Ryan Moss, DOB Mar 15, 1981, MRN [id number]  PCP:  Patient, No Pcp Per   [facility] Providers Cardiologist:  None { Click to update primary MD,subspecialty MD or APP then REFRESH:1}    Referring MD: No ref. provider found   No chief complaint on file. CP  History of Present Illness:    Ryan Moss is a 41 y.o. male with a hx of HTN, referral from the ED for CP.  Presented 8/2 to ED. Noted also have dizziness. He also was having ocassional CP. He went to the ED. Received 324 ASA and SL nitro.  He described positional dizziness.   He was hypertensive 176/101 mmHg. Troponin was negative x3. Sinus rhythm with significant LVH.    Past Medical History:  Diagnosis Date   Hypertension     No past surgical history on file.  Current Medications: No outpatient medications have been marked as taking for the 11/30/21 encounter (Appointment) with Maisie Fus, MD.     Allergies:   Patient has no known allergies.   Social History   Socioeconomic History   Marital status: Single    Spouse name: Not on file   Number of children: Not on file   Years of education: Not on file   Highest education level: Not on file  Occupational History   Not on file  Tobacco Use   Smoking status: Every Day    Packs/day: 1.00    Types: Cigarettes   Smokeless tobacco: Never  Substance and Sexual Activity   Alcohol use: No   Drug use: No   Sexual activity: Not on file  Other Topics Concern   Not on file  Social History Narrative   Not on file   Social Determinants of Health   Financial Resource Strain: Not on file  Food Insecurity: Not on file  Transportation Needs: Not on file  Physical Activity: Not on file  Stress: Not on file  Social Connections: Not on file     Family History: The patient's ***family history includes Hypertension in his mother.  ROS:   Please see the history of present illness.    *** All  other systems reviewed and are negative.  EKGs/Labs/Other Studies Reviewed:    The following studies were reviewed today: ***  EKG:  EKG is *** ordered today.  The ekg ordered today demonstrates ***  Recent Labs: 10/30/2021: ALT 28 11/25/2021: BUN 12; Creatinine, Ser 0.94; Hemoglobin 15.4; Platelets 226; Potassium 4.1; Sodium 138  Recent Lipid Panel No results found for: "CHOL", "TRIG", "HDL", "CHOLHDL", "VLDL", "LDLCALC", "LDLDIRECT"   Risk Assessment/Calculations:   {Does this patient have ATRIAL FIBRILLATION?:[phone]}       Physical Exam:    VS:  There were no vitals taken for this visit.    Wt Readings from Last 3 Encounters:  11/25/21 174 lb 2.6 oz (79 kg)  10/02/15 175 lb 11.2 oz (79.7 kg)  09/28/15 180 lb (81.6 kg)     GEN: *** Well nourished, well developed in no acute distress HEENT: Normal NECK: No JVD; No carotid bruits LYMPHATICS: No lymphadenopathy CARDIAC: ***RRR, no murmurs, rubs, gallops RESPIRATORY:  Clear to auscultation without rales, wheezing or rhonchi  ABDOMEN: Soft, non-tender, non-distended MUSCULOSKELETAL:  No edema; No deformity  SKIN: Warm and dry NEUROLOGIC:  Alert and oriented x 3 PSYCHIATRIC:  Normal affect   ASSESSMENT:    Hypertension: He has evidence of hypertensive heart disease. Will  start norvasc 10 and losartan 25 mg daily.  Dizziness: c/f BPPV.   PLAN:    In order of problems listed above:  Start norvasc 10 mg and losartan 25 mg daily       {Are you ordering a CV Procedure (e.g. stress test, cath, DCCV, TEE, etc)?   Press F2        :[id number]}    Medication Adjustments/Labs and Tests Ordered: Current medicines are reviewed at length with the patient today.  Concerns regarding medicines are outlined above.  No orders of the defined types were placed in this encounter.  No orders of the defined types were placed in this encounter.   There are no Patient Instructions on file for this visit.   Signed, Maisie Fus, MD  11/30/2021 8:12 AM    [facility]

## 2021-12-01 ENCOUNTER — Inpatient Hospital Stay: Payer: Self-pay | Admitting: Internal Medicine

## 2021-12-07 ENCOUNTER — Encounter: Payer: Self-pay | Admitting: Internal Medicine

## 2022-01-19 ENCOUNTER — Encounter (HOSPITAL_COMMUNITY): Payer: Self-pay | Admitting: Emergency Medicine

## 2022-01-19 ENCOUNTER — Emergency Department (HOSPITAL_COMMUNITY)
Admission: EM | Admit: 2022-01-19 | Discharge: 2022-01-19 | Disposition: A | Discharge status: Home / Self Care | Payer: Self-pay | Attending: Emergency Medicine | Admitting: Emergency Medicine

## 2022-01-19 ENCOUNTER — Other Ambulatory Visit: Payer: Self-pay

## 2022-01-19 DIAGNOSIS — J069 Acute upper respiratory infection, unspecified: Secondary | ICD-10-CM | POA: Insufficient documentation

## 2022-01-19 DIAGNOSIS — Z20822 Contact with and (suspected) exposure to covid-19: Secondary | ICD-10-CM | POA: Insufficient documentation

## 2022-01-19 DIAGNOSIS — R030 Elevated blood-pressure reading, without diagnosis of hypertension: Secondary | ICD-10-CM | POA: Insufficient documentation

## 2022-01-19 LAB — SARS CORONAVIRUS 2 BY RT PCR: SARS Coronavirus 2 by RT PCR: NEGATIVE

## 2022-01-19 NOTE — ED Triage Notes (Signed)
Pt reports spouse tested positive for covid and would like a covid test

## 2022-01-19 NOTE — Discharge Instructions (Signed)
Work note provided.  Use Tylenol and Motrin as needed for body aches, fevers or pain.  Return for breathing difficulty, persistent fevers or new concerns.

## 2022-01-19 NOTE — ED Provider Notes (Addendum)
[facility] EMERGENCY DEPARTMENT Provider Note   CSN: [id number] Arrival date & time: 01/19/22  8315     History  Chief Complaint  Patient presents with   Cough    Ryan Moss is a 41 y.o. male.  Patient with history of high blood pressure presents with cough, sneezing and exposure to significant other who tested positive for COVID.  Patient has no shortness of breath or chest pain.  Tolerating oral liquids.  Patient does work around multiple people.       Home Medications Prior to Admission medications   Medication Sig Start Date End Date Taking? Authorizing Provider  cyclobenzaprine (FLEXERIL) 10 MG tablet Take 1 tablet (10 mg total) by mouth 2 (two) times daily as needed for muscle spasms. 10/31/21   Pollyann Savoy, MD  naproxen (NAPROSYN) 500 MG tablet Take 1 tablet (500 mg total) by mouth 2 (two) times daily. 10/31/21   Pollyann Savoy, MD      Allergies    Patient has no known allergies.    Review of Systems   Review of Systems  Constitutional:  Negative for chills and fever.  HENT:  Positive for congestion.   Eyes:  Negative for visual disturbance.  Respiratory:  Positive for cough. Negative for shortness of breath.   Cardiovascular:  Negative for chest pain.  Gastrointestinal:  Negative for abdominal pain and vomiting.  Genitourinary:  Negative for dysuria and flank pain.  Musculoskeletal:  Negative for back pain, neck pain and neck stiffness.  Skin:  Negative for rash.  Neurological:  Negative for light-headedness and headaches.    Physical Exam Updated Vital Signs BP (!) 171/116 (BP Location: Right Arm)   Pulse 97   Temp 97.9 F (36.6 C) (Oral)   Resp 18   Ht 5\' 8"  (1.727 m)   Wt 78.9 kg   SpO2 97%   BMI 26.46 kg/m  Physical Exam Vitals and nursing note reviewed.  Constitutional:      General: He is not in acute distress.    Appearance: He is well-developed.  HENT:     Head: Normocephalic and atraumatic.     Nose:  Congestion present.     Mouth/Throat:     Mouth: Mucous membranes are moist.     Pharynx: No oropharyngeal exudate or posterior oropharyngeal erythema.  Eyes:     General:        Right eye: No discharge.        Left eye: No discharge.     Conjunctiva/sclera: Conjunctivae normal.  Neck:     Trachea: No tracheal deviation.  Cardiovascular:     Rate and Rhythm: Normal rate and regular rhythm.     Heart sounds: No murmur heard. Pulmonary:     Effort: Pulmonary effort is normal.     Breath sounds: Normal breath sounds.  Abdominal:     General: There is no distension.     Palpations: Abdomen is soft.     Tenderness: There is no abdominal tenderness. There is no guarding.  Musculoskeletal:     Cervical back: Normal range of motion and neck supple. No rigidity.  Skin:    General: Skin is warm.     Capillary Refill: Capillary refill takes less than 2 seconds.     Findings: No rash.  Neurological:     General: No focal deficit present.     Mental Status: He is alert.     Cranial Nerves: No cranial nerve deficit.  Psychiatric:  Mood and Affect: Mood normal.     ED Results / Procedures / Treatments   Labs (all labs ordered are listed, but only abnormal results are displayed) Labs Reviewed  SARS CORONAVIRUS 2 BY RT PCR    EKG None  Radiology No results found.  Procedures Procedures    Medications Ordered in ED Medications - No data to display  ED Course/ Medical Decision Making/ A&P                           Medical Decision Making  Patient presents with mild upper respiratory symptoms and close exposure to COVID.  COVID test ordered and results reviewed independently and negative.  Patient has normal work of breathing, normal oxygenation, blood pressure elevated however known history of high blood pressure no chest pain shortness of breath or stroke symptoms.  Patient stable for outpatient follow-up work note given.        Final Clinical Impression(s) /  ED Diagnoses Final diagnoses:  Acute upper respiratory infection  Elevated blood pressure reading    Rx / DC Orders ED Discharge Orders     None         Elnora Morrison, MD 01/19/22 1033    Elnora Morrison, MD 01/19/22 1034

## 2022-01-30 ENCOUNTER — Emergency Department (HOSPITAL_COMMUNITY)
Admission: EM | Admit: 2022-01-30 | Discharge: 2022-01-30 | Disposition: A | Discharge status: Home / Self Care | Payer: Self-pay | Attending: Emergency Medicine | Admitting: Emergency Medicine

## 2022-01-30 ENCOUNTER — Emergency Department (HOSPITAL_COMMUNITY): Payer: Self-pay

## 2022-01-30 DIAGNOSIS — E041 Nontoxic single thyroid nodule: Secondary | ICD-10-CM | POA: Insufficient documentation

## 2022-01-30 DIAGNOSIS — R0602 Shortness of breath: Secondary | ICD-10-CM | POA: Insufficient documentation

## 2022-01-30 DIAGNOSIS — J45909 Unspecified asthma, uncomplicated: Secondary | ICD-10-CM | POA: Insufficient documentation

## 2022-01-30 DIAGNOSIS — R079 Chest pain, unspecified: Secondary | ICD-10-CM | POA: Insufficient documentation

## 2022-01-30 DIAGNOSIS — Z79899 Other long term (current) drug therapy: Secondary | ICD-10-CM | POA: Insufficient documentation

## 2022-01-30 DIAGNOSIS — I10 Essential (primary) hypertension: Secondary | ICD-10-CM | POA: Insufficient documentation

## 2022-01-30 DIAGNOSIS — Z87891 Personal history of nicotine dependence: Secondary | ICD-10-CM | POA: Insufficient documentation

## 2022-01-30 LAB — CBC WITH DIFFERENTIAL/PLATELET
Abs Immature Granulocytes: 0.03 10*3/uL (ref 0.00–0.07)
Basophils Absolute: 0.1 10*3/uL (ref 0.0–0.1)
Basophils Relative: 1 %
Eosinophils Absolute: 0.2 10*3/uL (ref 0.0–0.5)
Eosinophils Relative: 3 %
HCT: 45.3 % (ref 39.0–52.0)
Hemoglobin: 15.7 g/dL (ref 13.0–17.0)
Immature Granulocytes: 0 %
Lymphocytes Relative: 31 %
Lymphs Abs: 2.7 10*3/uL (ref 0.7–4.0)
MCH: 29 pg (ref 26.0–34.0)
MCHC: 34.7 g/dL (ref 30.0–36.0)
MCV: 83.7 fL (ref 80.0–100.0)
Monocytes Absolute: 1.1 10*3/uL — ABNORMAL HIGH (ref 0.1–1.0)
Monocytes Relative: 12 %
Neutro Abs: 4.5 10*3/uL (ref 1.7–7.7)
Neutrophils Relative %: 53 %
Platelets: 246 10*3/uL (ref 150–400)
RBC: 5.41 MIL/uL (ref 4.22–5.81)
RDW: 14.1 % (ref 11.5–15.5)
WBC: 8.6 10*3/uL (ref 4.0–10.5)
nRBC: 0 % (ref 0.0–0.2)

## 2022-01-30 LAB — BASIC METABOLIC PANEL
Anion gap: 12 (ref 5–15)
BUN: 11 mg/dL (ref 6–20)
CO2: 20 mmol/L — ABNORMAL LOW (ref 22–32)
Calcium: 8.9 mg/dL (ref 8.9–10.3)
Chloride: 108 mmol/L (ref 98–111)
Creatinine, Ser: 0.85 mg/dL (ref 0.61–1.24)
GFR, Estimated: >60 mL/min (ref >60)
Glucose, Bld: 118 mg/dL — ABNORMAL HIGH (ref 70–99)
Potassium: 4.1 mmol/L (ref 3.5–5.1)
Sodium: 140 mmol/L (ref 135–145)

## 2022-01-30 LAB — TROPONIN I (HIGH SENSITIVITY)
Troponin I (High Sensitivity): 8 ng/L (ref <18)
Troponin I (High Sensitivity): 9 ng/L (ref <18)

## 2022-01-30 LAB — D-DIMER, QUANTITATIVE: D-Dimer, Quant: 2.4 ug/mL-FEU — ABNORMAL HIGH (ref 0.00–0.50)

## 2022-01-30 MED ORDER — AMLODIPINE BESYLATE 10 MG PO TABS: 10.0000 mg | ORAL_TABLET | Freq: Every day | ORAL | 0 refills | Status: AC

## 2022-01-30 MED ORDER — IOHEXOL 350 MG/ML SOLN
75.0000 mL | Freq: Once | INTRAVENOUS | Status: AC | PRN
  Administered 2022-01-30: 75 mL via INTRAVENOUS

## 2022-01-30 NOTE — ED Provider Notes (Signed)
[facility] EMERGENCY DEPARTMENT Provider Note   CSN: [id number] Arrival date & time: 01/30/22  1847     History  No chief complaint on file.   Ryan Moss is a 41 y.o. male.  HPI   This patient is a 41 year old male who reports a history of hypertension, he reports a history of about 1 pack/day tobacco use, he drinks alcohol, he denies a primary cardiac or pulmonary history including asthma or allergies.  He states that he has intermittent chest pain and shortness of breath.  In fact on my review of the medical record the patient has actually been seen in the past, as recently as September 26, August 2 and before that July 7 all with episodes of some type of chest pain or shortness of breath.  He has had an unremarkable work-up overall including a CT angiogram of the head and the neck performed in August, chest x-rays as well.  He has not had any evaluation for pulmonary embolism or venous thromboembolism.  He reports that he was playing with his 43 year old twins at home when he developed acute onset of chest pain and shortness of breath.  According to the paramedics the patient was initially slightly hypoxic and required oxygen, he at this time states that he feels better but still has intermittent chest pain and shortness of breath.  He actually reports that the sharp chest pain will sometimes last for seconds, sometimes for minutes, he gets it intermittently and has had it for several months and is very frustrated as he had thought that he had had heart or lung problems but no one is able to find out why he feels this way.  Most of the time he feels normal and has no issues.  Home Medications Prior to Admission medications   Medication Sig Start Date End Date Taking? Authorizing Provider  amLODipine (NORVASC) 10 MG tablet Take 1 tablet (10 mg total) by mouth daily. 01/30/22  Yes Eber Hong, MD  cyclobenzaprine (FLEXERIL) 10 MG tablet Take 1 tablet (10 mg total) by  mouth 2 (two) times daily as needed for muscle spasms. 10/31/21   Pollyann Savoy, MD  naproxen (NAPROSYN) 500 MG tablet Take 1 tablet (500 mg total) by mouth 2 (two) times daily. 10/31/21   Pollyann Savoy, MD      Allergies    Patient has no known allergies.    Review of Systems   Review of Systems  Constitutional:  Negative for chills and fever.  HENT:  Negative for sore throat.   Eyes:  Negative for visual disturbance.  Respiratory:  Positive for shortness of breath. Negative for cough.   Cardiovascular:  Positive for chest pain.  Gastrointestinal:  Negative for abdominal pain, diarrhea, nausea and vomiting.  Genitourinary:  Negative for dysuria and frequency.  Musculoskeletal:  Negative for back pain and neck pain.  Skin:  Negative for rash.  Neurological:  Negative for weakness, numbness and headaches.  Hematological:  Negative for adenopathy.  Psychiatric/Behavioral:  Negative for behavioral problems.     Physical Exam Updated Vital Signs BP (!) 157/113   Pulse 88   Temp 98.9 F (37.2 C) (Oral)   Resp 15   SpO2 94%  Physical Exam Vitals and nursing note reviewed.  Constitutional:      General: He is not in acute distress.    Appearance: He is well-developed.  HENT:     Head: Normocephalic and atraumatic.     Mouth/Throat:  Pharynx: No oropharyngeal exudate.  Eyes:     General: No scleral icterus.       Right eye: No discharge.        Left eye: No discharge.     Conjunctiva/sclera: Conjunctivae normal.     Pupils: Pupils are equal, round, and reactive to light.  Neck:     Thyroid: No thyromegaly.     Vascular: No JVD.  Cardiovascular:     Rate and Rhythm: Normal rate and regular rhythm.     Heart sounds: Normal heart sounds. No murmur heard.    No friction rub. No gallop.     Comments: Heart rate of 95, normal pulses, no JVD Pulmonary:     Effort: Pulmonary effort is normal. No respiratory distress.     Breath sounds: Normal breath sounds. No  wheezing or rales.  Abdominal:     General: Bowel sounds are normal. There is no distension.     Palpations: Abdomen is soft. There is no mass.     Tenderness: There is no abdominal tenderness.  Musculoskeletal:        General: No tenderness. Normal range of motion.     Cervical back: Normal range of motion and neck supple.  Lymphadenopathy:     Cervical: No cervical adenopathy.  Skin:    General: Skin is warm and dry.     Findings: No erythema or rash.  Neurological:     Mental Status: He is alert.     Coordination: Coordination normal.  Psychiatric:        Behavior: Behavior normal.     ED Results / Procedures / Treatments   Labs (all labs ordered are listed, but only abnormal results are displayed) Labs Reviewed  D-DIMER, QUANTITATIVE - Abnormal; Notable for the following components:      Result Value   D-Dimer, Quant 2.40 (*)    All other components within normal limits  CBC WITH DIFFERENTIAL/PLATELET - Abnormal; Notable for the following components:   Monocytes Absolute 1.1 (*)    All other components within normal limits  BASIC METABOLIC PANEL - Abnormal; Notable for the following components:   CO2 20 (*)    Glucose, Bld 118 (*)    All other components within normal limits  TROPONIN I (HIGH SENSITIVITY)  TROPONIN I (HIGH SENSITIVITY)    EKG EKG Interpretation  Date/Time:  Saturday January 30 2022 18:56:43 EDT Ventricular Rate:  100 PR Interval:  117 QRS Duration: 83 QT Interval:  339 QTC Calculation: 438 R Axis:   71 Text Interpretation: Sinus tachycardia Probable left atrial enlargement since last tracing no significant change Confirmed by Eber Hong ([id number]) on 01/30/2022 6:58:26 PM  Radiology CT Angio Chest PE W and/or Wo Contrast  Result Date: 01/30/2022 CLINICAL DATA:  Chest pressure, shortness of breath, hypoxia EXAM: CT ANGIOGRAPHY CHEST WITH CONTRAST TECHNIQUE: Multidetector CT imaging of the chest was performed using the standard protocol during  bolus administration of intravenous contrast. Multiplanar CT image reconstructions and MIPs were obtained to evaluate the vascular anatomy. RADIATION DOSE REDUCTION: This exam was performed according to the departmental dose-optimization program which includes automated exposure control, adjustment of the mA and/or kV according to patient size and/or use of iterative reconstruction technique. CONTRAST:  46mL OMNIPAQUE IOHEXOL 350 MG/ML SOLN COMPARISON:  05/30/2013, 01/31/2012 FINDINGS: Cardiovascular: This is a technically adequate evaluation of the pulmonary vasculature. No filling defects or pulmonary emboli. The heart is unremarkable without pericardial effusion. Normal caliber of the thoracic aorta. Mediastinum/Nodes: 2.4 cm  hypodense nodule lower pole right lobe thyroid. Trachea and esophagus are unremarkable. No pathologic adenopathy. Lungs/Pleura: No acute airspace disease, effusion, or pneumothorax. Mosaic ground-glass attenuation within the dependent lungs may reflect hypoventilatory change versus scattered areas of air trapping from small airway disease. No bronchial wall thickening or bronchiectasis. Central airways are patent. Upper Abdomen: No acute abnormality. Musculoskeletal: No acute or destructive bony lesions. Reconstructed images demonstrate no additional findings. Review of the MIP images confirms the above findings. IMPRESSION: 1. No evidence of pulmonary embolus. 2. Mosaic ground-glass attenuation within the dependent lower lobes, which may reflect hypoventilatory changes versus air trapping related to bronchiolitis. 3. 2.4 cm incidental right thyroid nodule. Recommend non-emergent outpatient thyroid ultrasound if not previously performed. Reference: J Am Coll Radiol. 2015 Feb;12(2): 143-50 Electronically Signed   By: Sharlet Salina M.D.   On: 01/30/2022 21:20   DG Chest Port 1 View  Result Date: 01/30/2022 CLINICAL DATA:  Short of breath, chest pressure, hypoxia EXAM: PORTABLE CHEST 1  VIEW COMPARISON:  11/25/2021 FINDINGS: Single frontal view of the chest demonstrates an unremarkable cardiac silhouette. No airspace disease, effusion, or pneumothorax. No acute bony abnormality. IMPRESSION: 1. No acute intrathoracic process. Electronically Signed   By: Sharlet Salina M.D.   On: 01/30/2022 19:56    Procedures Procedures    Medications Ordered in ED Medications  iohexol (OMNIPAQUE) 350 MG/ML injection 75 mL (75 mLs Intravenous Contrast Given 01/30/22 2111)    ED Course/ Medical Decision Making/ A&P                           Medical Decision Making Amount and/or Complexity of Data Reviewed Labs: ordered. Radiology: ordered.  Risk Prescription drug management.   This patient presents to the ED for concern of chest pain and shortness of breath, this involves an extensive number of treatment options, and is a complaint that carries with it a high risk of complications and morbidity.  The differential diagnosis includes pulmonary embolism, acute coronary syndrome though that seems less likely especially given the EKG which is essentially unchanged.  Would consider asthma, allergies, pulmonary embolism, pneumonia seems less likely, pneumothorax is a possibility as well, pericarditis however there is no positional changes and this is been going on for months intermittently   Co morbidities that complicate the patient evaluation  The patient is overweight, he smokes cigarettes and has hypertension which he endorses is untreated at this time, he cannot tell me why he just dates he does not take the medicines   Additional history obtained:  Additional history obtained from electronic medical record External records from outside source obtained and reviewed including prior imaging   Lab Tests:  I Ordered, and personally interpreted labs.  The pertinent results include: CBC metabolic panel D-dimer and a troponin, D-dimer was elevated   Imaging Studies ordered:  I  ordered imaging studies including portable chest x-ray I independently visualized and interpreted imaging which showed no acute findings CT angiogram showed a thyroid nodule but no other findings of the heart or lungs of any concern and no pulmonary embolism I agree with the radiologist interpretation   Cardiac Monitoring: / EKG:  The patient was maintained on a cardiac monitor.  I personally viewed and interpreted the cardiac monitored which showed an underlying rhythm of: Normal sinus rhythm, the patient maintained his hypertension, he will need to be prescribed an antihypertensive    Problem List / ED Course / Critical interventions / Medication management  Antihypertensives have been prescribed for the patient, amlodipine I ordered medication including amlodipine for hypertension The patient was otherwise asymptomatic with regards to chest pain or shortness of breath while here and did not require any oxygen I have reviewed the patients home medicines and have made adjustments as needed   Social Determinants of Health:  Tobacco and alcohol use   Test / Admission - Considered:  Considered admission but no pathologic findings on work-up have been found, the patient is stable for discharge, this does not appear to be consistent with an acute coronary syndrome.  The patient was informed and is agreeable    I have discussed with the patient at the bedside the results, and the meaning of these results.  They have expressed her understanding to the need for follow-up with primary care physician         Final Clinical Impression(s) / ED Diagnoses Final diagnoses:  Chest pain, unspecified type  Thyroid nodule  Primary hypertension    Rx / DC Orders ED Discharge Orders          Ordered    amLODipine (NORVASC) 10 MG tablet  Daily        01/30/22 2229              Noemi Chapel, MD 01/30/22 2232

## 2022-01-30 NOTE — ED Triage Notes (Signed)
Pt BIB [facility] from home where he began feeling pressure in his chest & SOB while playing with his child. He laid down & it did NOT subside. E<S arrived & his O2 was 86% on RA, A/Ox4, no other pain or complaints, 12 L unremarkable, 97% on RA, 158/94, 20g Lt AC.

## 2022-01-30 NOTE — Discharge Instructions (Addendum)
Your testing today showed that you have no signs of blood clot, no signs of heart attack, your lungs showed no signs of pneumonia, the CAT scan did incidentally show that you have a nodule on your thyroid which she will need to follow-up with your family doctor.  They will likely need to do an ultrasound and more blood work to work-up your thyroid.  This is not why you are having symptoms but it needs to be checked.  Your blood pressure has been elevated, I want you to take the medication called amlodipine once a day.  This is a medication that is very easy to take, it will help to lower your blood pressure, you should have your family doctor recheck your blood pressure within 2 weeks when you follow-up with them about your thyroid nodule  If you do not have a family doctor see the phone number above for the community health and wellness clinic.  Thank you for allowing [place] to treat you in the emergency department today.  After reviewing your examination and potential testing that was done it appears that you are safe to go home.  I would like for you to follow-up with your doctor within the next several days, have them obtain your results and follow-up with them to review all of these tests.  If you should develop severe or worsening symptoms return to the emergency department immediately

## 2022-01-30 NOTE — ED Notes (Signed)
Patient verbalizes understanding of discharge instructions. Opportunity for questioning and answers were provided. Armband removed by staff, pt discharged from ED.  

## 2022-08-18 ENCOUNTER — Encounter (HOSPITAL_COMMUNITY): Payer: Self-pay | Admitting: Emergency Medicine

## 2022-08-18 ENCOUNTER — Other Ambulatory Visit: Payer: Self-pay

## 2022-08-18 ENCOUNTER — Ambulatory Visit (HOSPITAL_COMMUNITY)
Admission: EM | Admit: 2022-08-18 | Discharge: 2022-08-18 | Disposition: A | Discharge status: Home / Self Care | Payer: 59 | Attending: Emergency Medicine | Admitting: Emergency Medicine

## 2022-08-18 DIAGNOSIS — R200 Anesthesia of skin: Secondary | ICD-10-CM

## 2022-08-18 DIAGNOSIS — R202 Paresthesia of skin: Secondary | ICD-10-CM | POA: Diagnosis not present

## 2022-08-18 DIAGNOSIS — M79604 Pain in right leg: Secondary | ICD-10-CM

## 2022-08-18 DIAGNOSIS — M545 Low back pain, unspecified: Secondary | ICD-10-CM | POA: Diagnosis not present

## 2022-08-18 LAB — POCT FASTING CBG KUC MANUAL ENTRY: POCT Glucose (KUC): 148 mg/dL — AB (ref 70–99)

## 2022-08-18 MED ORDER — METHYLPREDNISOLONE SODIUM SUCC 125 MG IJ SOLR
INTRAMUSCULAR | Status: AC
  Filled 2022-08-18: qty 2

## 2022-08-18 MED ORDER — METHYLPREDNISOLONE SODIUM SUCC 125 MG IJ SOLR
60.0000 mg | Freq: Once | INTRAMUSCULAR | Status: AC
  Administered 2022-08-18: 60 mg via INTRAMUSCULAR

## 2022-08-18 MED ORDER — KETOROLAC TROMETHAMINE 60 MG/2ML IM SOLN
INTRAMUSCULAR | Status: AC
  Filled 2022-08-18: qty 2

## 2022-08-18 MED ORDER — KETOROLAC TROMETHAMINE 60 MG/2ML IM SOLN
60.0000 mg | Freq: Once | INTRAMUSCULAR | Status: AC
  Administered 2022-08-18: 60 mg via INTRAMUSCULAR

## 2022-08-18 NOTE — ED Triage Notes (Signed)
Left hand intermittent numbness for a month.  Patient was right handed-old injury to right wrist dictated that patient use left hand more than in past.  Patient has pain in right leg.  Reports intermittent episodes of pain.  Pain in lateral right thigh, knee pain  Has not had medication for pain

## 2022-08-18 NOTE — Discharge Instructions (Addendum)
Your blood sugar was 148 today in clinic, this is elevated but not the most accurate sample.  We have given you a steroid injection and a Toradol injection in clinic today to help manage her pain and inflammation.  I am unsure the etiology of your symptoms, and would like you to follow-up with a primary care provider for further evaluation.  You can take 100 mg of ibuprofen every 6-8 hours as needed for pain and discomfort.  Please return to clinic or seek immediate care if you develop chest pain, shortness of breath, inability to walk, or any new concerning symptoms.

## 2022-08-18 NOTE — ED Provider Notes (Signed)
[facility]    CSN: [id number] Arrival date & time: 08/18/22  1638      History   Chief Complaint No chief complaint on file.   HPI Yandell Mcjunkins is a 42 y.o. male.   Patient presents to clinic for multiple symptoms.  He has been having left hand numbness and pain that started a few weeks or months ago.  Patient is unsure of timeline.  He denies injury to this hand.  Reports it is his entire hand that will intermittently go numb and it feels tight.  Denies any injuries or falls.  He is prediabetic.  Does not have a primary care provider and has not had recent labs.  We will occasionally have right leg pain.  Pain is mostly to right posterior thigh, sometimes his posterior knee, sometimes it radiates down to his posterior ankle.  Sometimes upper thigh pain.  He also has intermittent right lower back pain.  Denies pain radiating from back down to his thigh.  Denies swelling. Denies injury. Denies dysuria, flank pain, hematuria or fevers.    The history is provided by the patient and medical records.    Past Medical History:  Diagnosis Date   Hypertension     Patient Active Problem List   Diagnosis Date Noted   Prediabetes 10/03/2015   Cellulitis 10/02/2015   Hypertension    Cellulitis of left lower extremity     Past Surgical History:  Procedure Laterality Date   WRIST SURGERY Right        Home Medications    Prior to Admission medications   Medication Sig Start Date End Date Taking? Authorizing Provider  amLODipine (NORVASC) 10 MG tablet Take 1 tablet (10 mg total) by mouth daily. Patient not taking: Reported on 08/18/2022 01/30/22   Eber Hong, MD  cyclobenzaprine (FLEXERIL) 10 MG tablet Take 1 tablet (10 mg total) by mouth 2 (two) times daily as needed for muscle spasms. 10/31/21   Pollyann Savoy, MD  naproxen (NAPROSYN) 500 MG tablet Take 1 tablet (500 mg total) by mouth 2 (two) times daily. Patient not taking: Reported on 08/18/2022 10/31/21    Pollyann Savoy, MD    Family History Family History  Problem Relation Age of Onset   Hypertension Mother     Social History Social History   Tobacco Use   Smoking status: Every Day    Packs/day: 1    Types: Cigarettes   Smokeless tobacco: Never  Vaping Use   Vaping Use: Never used  Substance Use Topics   Alcohol use: No   Drug use: No     Allergies   Patient has no known allergies.   Review of Systems Review of Systems  Constitutional:  Negative for fever.  Respiratory:  Negative for cough and shortness of breath.   Cardiovascular:  Negative for chest pain.  Gastrointestinal:  Negative for abdominal pain.  Genitourinary:  Negative for dysuria and hematuria.  Musculoskeletal:  Positive for arthralgias and back pain.     Physical Exam Triage Vital Signs ED Triage Vitals [08/18/22 1708]  Enc Vitals Group     BP 134/88     Pulse Rate 92     Resp 20     Temp      Temp Source Oral     SpO2 97 %     Weight      Height      Head Circumference      Peak Flow  Pain Score      Pain Loc      Pain Edu?      Excl. in GC?    No data found.  Updated Vital Signs BP 134/88 (BP Location: Right Arm)   Pulse 92   Resp 20   SpO2 97%   Visual Acuity Right Eye Distance:   Left Eye Distance:   Bilateral Distance:    Right Eye Near:   Left Eye Near:    Bilateral Near:     Physical Exam Vitals and nursing note reviewed.  Constitutional:      Appearance: Normal appearance.  HENT:     Head: Normocephalic and atraumatic.     Right Ear: External ear normal.     Left Ear: External ear normal.     Nose: Nose normal.     Mouth/Throat:     Mouth: Mucous membranes are moist.  Eyes:     General: No scleral icterus.    Conjunctiva/sclera: Conjunctivae normal.  Cardiovascular:     Rate and Rhythm: Normal rate and regular rhythm.  Pulmonary:     Effort: Pulmonary effort is normal. No respiratory distress.  Musculoskeletal:        General: No swelling,  tenderness, deformity or signs of injury. Normal range of motion.     Right lower leg: No edema.     Left lower leg: No edema.  Skin:    General: Skin is warm and dry.     Capillary Refill: Capillary refill takes less than 2 seconds.     Findings: No rash.  Neurological:     General: No focal deficit present.     Mental Status: He is alert and oriented to person, place, and time.  Psychiatric:        Mood and Affect: Mood normal.        Behavior: Behavior normal. Behavior is cooperative.      UC Treatments / Results  Labs (all labs ordered are listed, but only abnormal results are displayed) Labs Reviewed  POCT FASTING CBG [facility] MANUAL ENTRY - Abnormal; Notable for the following components:      Result Value   POCT Glucose ([facility]) 148 (*)    All other components within normal limits    EKG   Radiology No results found.  Procedures Procedures (including critical care time)  Medications Ordered in UC Medications  methylPREDNISolone sodium succinate (SOLU-MEDROL) 125 mg/2 mL injection 60 mg (has no administration in time range)  ketorolac (TORADOL) injection 60 mg (has no administration in time range)    Initial Impression / Assessment and Plan / UC Course  I have reviewed the triage vital signs and the nursing notes.  Pertinent labs & imaging results that were available during my care of the patient were reviewed by me and considered in my medical decision making (see chart for details).  Vitals and triage reviewed, patient is hemodynamically stable.  Left hand paresthesia and pain that is intermittent.  No acute injury, has been ongoing for months, no indication for imaging at this time.  Intermittent right leg pain with varied location, no acute injury, no numbness, no tingling.  Advised to establish with a primary care provider for further evaluation.  Does have a history of prediabetes, CBG was 148 in clinic.  Given IM Toradol and Solu-Medrol in clinic for pain and  inflammation.  Without chest pain or shortness of breath, low concern for cardiac abnormalities.  Patient verbalized understanding, no questions at this time.  Final Clinical Impressions(s) / UC Diagnoses   Final diagnoses:  Right leg pain  Numbness and tingling in left hand  Right-sided low back pain without sciatica, unspecified chronicity     Discharge Instructions      Your blood sugar was 148 today in clinic, this is elevated but not the most accurate sample.  We have given you a steroid injection and a Toradol injection in clinic today to help manage her pain and inflammation.  I am unsure the etiology of your symptoms, and would like you to follow-up with a primary care provider for further evaluation.  You can take 100 mg of ibuprofen every 6-8 hours as needed for pain and discomfort.  Please return to clinic or seek immediate care if you develop chest pain, shortness of breath, inability to walk, or any new concerning symptoms.      ED Prescriptions   None    PDMP not reviewed this encounter.   Maximillion Gill, [place], [place] 08/18/22 1750

## 2022-10-25 ENCOUNTER — Ambulatory Visit (HOSPITAL_COMMUNITY): Payer: 59

## 2022-11-12 ENCOUNTER — Emergency Department (HOSPITAL_COMMUNITY): Payer: 59

## 2022-11-12 ENCOUNTER — Encounter (HOSPITAL_COMMUNITY): Payer: Self-pay

## 2022-11-12 ENCOUNTER — Emergency Department (HOSPITAL_COMMUNITY)
Admission: EM | Admit: 2022-11-12 | Discharge: 2022-11-12 | Disposition: A | Discharge status: Home / Self Care | Payer: 59 | Attending: Emergency Medicine | Admitting: Emergency Medicine

## 2022-11-12 DIAGNOSIS — E041 Nontoxic single thyroid nodule: Secondary | ICD-10-CM

## 2022-11-12 DIAGNOSIS — S199XXA Unspecified injury of neck, initial encounter: Secondary | ICD-10-CM | POA: Diagnosis not present

## 2022-11-12 DIAGNOSIS — S299XXA Unspecified injury of thorax, initial encounter: Secondary | ICD-10-CM | POA: Diagnosis not present

## 2022-11-12 DIAGNOSIS — S4992XA Unspecified injury of left shoulder and upper arm, initial encounter: Secondary | ICD-10-CM | POA: Diagnosis not present

## 2022-11-12 DIAGNOSIS — M25512 Pain in left shoulder: Secondary | ICD-10-CM | POA: Diagnosis not present

## 2022-11-12 DIAGNOSIS — Y92410 Unspecified street and highway as the place of occurrence of the external cause: Secondary | ICD-10-CM | POA: Diagnosis not present

## 2022-11-12 DIAGNOSIS — R109 Unspecified abdominal pain: Secondary | ICD-10-CM | POA: Insufficient documentation

## 2022-11-12 DIAGNOSIS — R102 Pelvic and perineal pain: Secondary | ICD-10-CM | POA: Diagnosis not present

## 2022-11-12 DIAGNOSIS — S0990XA Unspecified injury of head, initial encounter: Secondary | ICD-10-CM | POA: Diagnosis not present

## 2022-11-12 DIAGNOSIS — R0789 Other chest pain: Secondary | ICD-10-CM | POA: Diagnosis not present

## 2022-11-12 DIAGNOSIS — I10 Essential (primary) hypertension: Secondary | ICD-10-CM | POA: Diagnosis not present

## 2022-11-12 DIAGNOSIS — Z79899 Other long term (current) drug therapy: Secondary | ICD-10-CM | POA: Insufficient documentation

## 2022-11-12 DIAGNOSIS — S3991XA Unspecified injury of abdomen, initial encounter: Secondary | ICD-10-CM | POA: Diagnosis not present

## 2022-11-12 DIAGNOSIS — S3993XA Unspecified injury of pelvis, initial encounter: Secondary | ICD-10-CM | POA: Diagnosis not present

## 2022-11-12 DIAGNOSIS — M542 Cervicalgia: Secondary | ICD-10-CM | POA: Diagnosis not present

## 2022-11-12 LAB — CBC
HCT: 47.9 % (ref 39.0–52.0)
Hemoglobin: 15.7 g/dL (ref 13.0–17.0)
MCH: 28 pg (ref 26.0–34.0)
MCHC: 32.8 g/dL (ref 30.0–36.0)
MCV: 85.4 fL (ref 80.0–100.0)
Platelets: 249 10*3/uL (ref 150–400)
RBC: 5.61 MIL/uL (ref 4.22–5.81)
RDW: 13.8 % (ref 11.5–15.5)
WBC: 9.4 10*3/uL (ref 4.0–10.5)
nRBC: 0 % (ref 0.0–0.2)

## 2022-11-12 LAB — COMPREHENSIVE METABOLIC PANEL
ALT: 18 U/L (ref 0–44)
AST: 26 U/L (ref 15–41)
Albumin: 3.7 g/dL (ref 3.5–5.0)
Alkaline Phosphatase: 104 U/L (ref 38–126)
Anion gap: 10 (ref 5–15)
BUN: 12 mg/dL (ref 6–20)
CO2: 20 mmol/L — ABNORMAL LOW (ref 22–32)
Calcium: 8.8 mg/dL — ABNORMAL LOW (ref 8.9–10.3)
Chloride: 109 mmol/L (ref 98–111)
Creatinine, Ser: 1.19 mg/dL (ref 0.61–1.24)
GFR, Estimated: >60 mL/min (ref >60)
Glucose, Bld: 147 mg/dL — ABNORMAL HIGH (ref 70–99)
Potassium: 4 mmol/L (ref 3.5–5.1)
Sodium: 139 mmol/L (ref 135–145)
Total Bilirubin: 0.3 mg/dL (ref 0.3–1.2)
Total Protein: 6.8 g/dL (ref 6.5–8.1)

## 2022-11-12 LAB — I-STAT CHEM 8, ED
BUN: 14 mg/dL (ref 6–20)
Calcium, Ion: 1.06 mmol/L — ABNORMAL LOW (ref 1.15–1.40)
Chloride: 108 mmol/L (ref 98–111)
Creatinine, Ser: 1.2 mg/dL (ref 0.61–1.24)
Glucose, Bld: 145 mg/dL — ABNORMAL HIGH (ref 70–99)
HCT: 49 % (ref 39.0–52.0)
Hemoglobin: 16.7 g/dL (ref 13.0–17.0)
Potassium: 4 mmol/L (ref 3.5–5.1)
Sodium: 141 mmol/L (ref 135–145)
TCO2: 24 mmol/L (ref 22–32)

## 2022-11-12 LAB — SAMPLE TO BLOOD BANK

## 2022-11-12 LAB — PROTIME-INR
INR: 1 (ref 0.8–1.2)
Prothrombin Time: 13.3 seconds (ref 11.4–15.2)

## 2022-11-12 LAB — I-STAT CG4 LACTIC ACID, ED: Lactic Acid, Venous: 1.7 mmol/L (ref 0.5–1.9)

## 2022-11-12 LAB — ETHANOL: Alcohol, Ethyl (B): <10 mg/dL (ref <10)

## 2022-11-12 MED ORDER — NAPROXEN 375 MG PO TABS: 375.0000 mg | ORAL_TABLET | Freq: Two times a day (BID) | ORAL | 0 refills | Status: DC

## 2022-11-12 MED ORDER — FENTANYL CITRATE PF 50 MCG/ML IJ SOSY
50.0000 ug | PREFILLED_SYRINGE | Freq: Once | INTRAMUSCULAR | Status: AC
  Administered 2022-11-12: 50 ug via INTRAVENOUS

## 2022-11-12 MED ORDER — CYCLOBENZAPRINE HCL 10 MG PO TABS: 10.0000 mg | ORAL_TABLET | Freq: Two times a day (BID) | ORAL | 0 refills | Status: DC | PRN

## 2022-11-12 MED ORDER — NAPROXEN 375 MG PO TABS: 375.0000 mg | ORAL_TABLET | Freq: Two times a day (BID) | ORAL | 0 refills | Status: AC

## 2022-11-12 MED ORDER — CYCLOBENZAPRINE HCL 10 MG PO TABS: 10.0000 mg | ORAL_TABLET | Freq: Two times a day (BID) | ORAL | 0 refills | Status: AC | PRN

## 2022-11-12 MED ORDER — ONDANSETRON HCL 4 MG/2ML IJ SOLN
4.0000 mg | Freq: Once | INTRAMUSCULAR | Status: AC
  Administered 2022-11-12: 4 mg via INTRAVENOUS

## 2022-11-12 MED ORDER — IOHEXOL 350 MG/ML SOLN
75.0000 mL | Freq: Once | INTRAVENOUS | Status: AC | PRN
  Administered 2022-11-12: 75 mL via INTRAVENOUS

## 2022-11-12 MED ORDER — HYDROMORPHONE HCL 1 MG/ML IJ SOLN
1.0000 mg | Freq: Once | INTRAMUSCULAR | Status: AC
  Administered 2022-11-12: 1 mg via INTRAVENOUS
  Filled 2022-11-12: qty 1

## 2022-11-12 NOTE — ED Notes (Signed)
Back from CT. Pt allergic to contrast, needs to be pre-medicated.

## 2022-11-12 NOTE — Discharge Instructions (Addendum)
For your pain, in addition to the prescribed Flexeril and naproxen, recommend taking Tylenol.  You may take up to 1000 mg 4 times a day of tylenol (acetaminophen) for one week.  This is the maximum dose of Tylenol that you can take--please check over-the-counter medications and other prescriptions to make sure they do not also contain acetaminophen.  I also recommend an over-the-counter lidocaine patch. You may use the shoulder sling for comfort, please perform range of motion exercises and follow up with your doctor. Return to the ED if you develop any other concerning new symptoms.
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
## 2022-11-13 ENCOUNTER — Encounter (HOSPITAL_COMMUNITY): Payer: Self-pay | Admitting: Emergency Medicine

## 2023-01-21 ENCOUNTER — Encounter (HOSPITAL_COMMUNITY): Payer: Self-pay

## 2023-01-21 ENCOUNTER — Ambulatory Visit (INDEPENDENT_AMBULATORY_CARE_PROVIDER_SITE_OTHER): Payer: 59

## 2023-01-21 ENCOUNTER — Ambulatory Visit (HOSPITAL_COMMUNITY)
Admission: EM | Admit: 2023-01-21 | Discharge: 2023-01-21 | Disposition: A | Discharge status: Home / Self Care | Payer: 59 | Attending: Internal Medicine | Admitting: Internal Medicine

## 2023-01-21 DIAGNOSIS — S81832A Puncture wound without foreign body, left lower leg, initial encounter: Secondary | ICD-10-CM | POA: Diagnosis not present

## 2023-01-21 DIAGNOSIS — L03116 Cellulitis of left lower limb: Secondary | ICD-10-CM

## 2023-01-21 DIAGNOSIS — L03115 Cellulitis of right lower limb: Secondary | ICD-10-CM

## 2023-01-21 DIAGNOSIS — L02416 Cutaneous abscess of left lower limb: Secondary | ICD-10-CM

## 2023-01-21 DIAGNOSIS — R7303 Prediabetes: Secondary | ICD-10-CM

## 2023-01-21 DIAGNOSIS — S8992XA Unspecified injury of left lower leg, initial encounter: Secondary | ICD-10-CM | POA: Diagnosis not present

## 2023-01-21 MED ORDER — SULFAMETHOXAZOLE-TRIMETHOPRIM 800-160 MG PO TABS: 1.0000 | ORAL_TABLET | Freq: Two times a day (BID) | ORAL | 0 refills | Status: AC
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
## 2023-01-21 NOTE — ED Provider Notes (Signed)
[facility]    CSN: [id number] Arrival date & time: 01/21/23  1137      History   Chief Complaint Chief Complaint  Patient presents with   Wound Check    HPI Ryan Moss is a 42 y.o. male.   Patient presents to urgent care for evaluation of wounds to the bilateral shins.  He reports puncture wound to the left shin from a large metal rod structure on his bed frame that happened 3 weeks ago.  States approximately 4 inches of the metal rod entered his left shin but he was able to remove all of the metal in its entirety after the puncture wound.  Wound was healing appropriately until approximately 3 to 4 days ago when he noticed swelling, warmth, and significant tenderness to the wound.  He also reports multiple scratches and abrasions to the bilateral lower extremities at the shins secondary to barb wire injury approximately 1 week ago.  There are areas of swelling and tenderness to the right shin as well as a result of barb wire injuries.  Last tetanus injection was less than 5 years ago.  He is a prediabetic, otherwise no history of immunosuppression.  No recent antibiotic/steroid use.  No recent fevers, chills, or bodyaches.  No numbness or tingling to bilateral lower extremities.  Ambulatory with steady gait without difficulty.  Using OTC medications for pain without relief.      Past Medical History:  Diagnosis Date   Hypertension     Patient Active Problem List   Diagnosis Date Noted   Prediabetes 10/03/2015   Cellulitis 10/02/2015   Hypertension    Cellulitis of left lower extremity     Past Surgical History:  Procedure Laterality Date   WRIST SURGERY Right        Home Medications    Prior to Admission medications   Medication Sig Start Date End Date Taking? Authorizing Provider  sulfamethoxazole-trimethoprim (BACTRIM DS) 800-160 MG tablet Take 1 tablet by mouth 2 (two) times daily for 7 days. 01/21/23 01/28/23 Yes Carlisle Beers, FNP   amLODipine (NORVASC) 10 MG tablet Take 1 tablet (10 mg total) by mouth daily. Patient not taking: Reported on 08/18/2022 01/30/22   Eber Hong, MD  cyclobenzaprine (FLEXERIL) 10 MG tablet Take 1 tablet (10 mg total) by mouth 2 (two) times daily as needed for muscle spasms. 10/31/21   Pollyann Savoy, MD  cyclobenzaprine (FLEXERIL) 10 MG tablet Take 1 tablet (10 mg total) by mouth 2 (two) times daily as needed for muscle spasms. 11/12/22   Alvira Monday, MD  naproxen (NAPROSYN) 375 MG tablet Take 1 tablet (375 mg total) by mouth 2 (two) times daily with a meal. 11/12/22   Alvira Monday, MD  naproxen (NAPROSYN) 500 MG tablet Take 1 tablet (500 mg total) by mouth 2 (two) times daily. Patient not taking: Reported on 08/18/2022 10/31/21   Pollyann Savoy, MD    Family History Family History  Problem Relation Age of Onset   Hypertension Mother     Social History Social History   Tobacco Use   Smoking status: Every Day    Current packs/day: 0.50    Types: Cigarettes   Smokeless tobacco: Never  Vaping Use   Vaping status: Some Days   Substances: Nicotine  Substance Use Topics   Alcohol use: Yes   Drug use: No     Allergies   Patient has no known allergies.   Review of Systems Review of Systems  Per HPI  Physical Exam Triage Vital Signs ED Triage Vitals  Encounter Vitals Group     BP 01/21/23 1149 (!) 143/93     Systolic BP Percentile --      Diastolic BP Percentile --      Pulse Rate 01/21/23 1149 94     Resp 01/21/23 1149 16     Temp 01/21/23 1149 97.9 F (36.6 C)     Temp Source 01/21/23 1149 Oral     SpO2 01/21/23 1149 95 %     Weight --      Height --      Head Circumference --      Peak Flow --      Pain Score 01/21/23 1151 7     Pain Loc --      Pain Education --      Exclude from Growth Chart --    No data found.  Updated Vital Signs BP (!) 143/93 (BP Location: Right Arm)   Pulse 94   Temp 97.9 F (36.6 C) (Oral)   Resp 16   SpO2 95%    Visual Acuity Right Eye Distance:   Left Eye Distance:   Bilateral Distance:    Right Eye Near:   Left Eye Near:    Bilateral Near:     Physical Exam Vitals and nursing note reviewed.  Constitutional:      Appearance: He is not ill-appearing or toxic-appearing.  HENT:     Head: Normocephalic and atraumatic.     Right Ear: Hearing and external ear normal.     Left Ear: Hearing and external ear normal.     Nose: Nose normal.     Mouth/Throat:     Lips: Pink.  Eyes:     General: Lids are normal. Vision grossly intact. Gaze aligned appropriately.     Extraocular Movements: Extraocular movements intact.     Conjunctiva/sclera: Conjunctivae normal.  Pulmonary:     Effort: Pulmonary effort is normal.  Musculoskeletal:     Cervical back: Neck supple.  Skin:    General: Skin is warm and dry.     Capillary Refill: Capillary refill takes less than 2 seconds.     Findings: Erythema and lesion present. No rash.     Comments: Multiple areas of erythema, warmth, and puncture wounds/regions to the bilateral anterior shins as seen in images below.  Neurological:     General: No focal deficit present.     Mental Status: He is alert and oriented to person, place, and time. Mental status is at baseline.     Cranial Nerves: No dysarthria or facial asymmetry.  Psychiatric:        Mood and Affect: Mood normal.        Speech: Speech normal.        Behavior: Behavior normal.        Thought Content: Thought content normal.        Judgment: Judgment normal.   Right shin  Anterior shins  Left shin, puncture wound due to metal object distally with significant erythema, warmth, and swelling to palpation. Very tender to palpation.    UC Treatments / Results  Labs (all labs ordered are listed, but only abnormal results are displayed) Labs Reviewed - No data to display  EKG   Radiology No results found.  Procedures Procedures (including critical care time)  Medications Ordered in  UC Medications - No data to display  Initial Impression / Assessment and Plan / UC Course  I have reviewed the triage vital signs and the nursing notes.  Pertinent labs & imaging results that were available during my care of the patient were reviewed by me and considered in my medical decision making (see chart for details).   1.  Puncture wound of left lower extremity excluding thigh, cellulitis and abscess of left leg, prediabetes X-ray of the left shin to evaluate for possible retained foreign body/acute bony abnormality given recent traumatic puncture wound is unremarkable by my interpretation for bony abnormality/retained foreign body.  We will call patient if radiology reread shows any different interpretation change in treatment plan. Tetanus is up-to-date. Will treat cellulitis with Bactrim DS twice daily for 7 days. Infection return precautions discussed. Neurovascularly intact distally to injury. Warm compresses encouraged.  Counseled patient on potential for adverse effects with medications prescribed/recommended today, strict ER and return-to-clinic precautions discussed, patient verbalized understanding.    Final Clinical Impressions(s) / UC Diagnoses   Final diagnoses:  Puncture wound of left lower extremity excluding thigh, initial encounter  Cellulitis and abscess of left leg  Cellulitis of leg, right  Prediabetes     Discharge Instructions      Your x-rays look great, no signs of broken bones due to your injuries.  Your wounds appear very infected.  Take bactrim antibiotic twice a day for 7 days.  Use warm compresses to the legs to help with swelling and drainage related to infection.  Elevate the legs to reduce swelling.  Ibuprofen/tylenol as needed for pain.  If you develop any new or worsening symptoms or if your symptoms do not start to improve, please return here or follow-up with your primary care provider. If your symptoms are severe, please go to  the emergency room.     ED Prescriptions     Medication Sig Dispense Auth. Provider   sulfamethoxazole-trimethoprim (BACTRIM DS) 800-160 MG tablet Take 1 tablet by mouth 2 (two) times daily for 7 days. 14 tablet Carlisle Beers, FNP      PDMP not reviewed this encounter.   Carlisle Beers, [place] 01/21/23 1253

## 2023-01-21 NOTE — Discharge Instructions (Signed)
Your x-rays look great, no signs of broken bones due to your injuries.  Your wounds appear very infected.  Take bactrim antibiotic twice a day for 7 days.  Use warm compresses to the legs to help with swelling and drainage related to infection.  Elevate the legs to reduce swelling.  Ibuprofen/tylenol as needed for pain.  If you develop any new or worsening symptoms or if your symptoms do not start to improve, please return here or follow-up with your primary care provider. If your symptoms are severe, please go to the emergency room.

## 2023-04-09 ENCOUNTER — Encounter (HOSPITAL_COMMUNITY): Payer: Self-pay

## 2023-04-09 ENCOUNTER — Other Ambulatory Visit: Payer: Self-pay

## 2023-04-09 ENCOUNTER — Emergency Department (HOSPITAL_COMMUNITY)
Admission: EM | Admit: 2023-04-09 | Discharge: 2023-04-09 | Disposition: A | Discharge status: Home / Self Care | Payer: 59 | Attending: Emergency Medicine | Admitting: Emergency Medicine

## 2023-04-09 DIAGNOSIS — L84 Corns and callosities: Secondary | ICD-10-CM | POA: Insufficient documentation

## 2023-04-09 DIAGNOSIS — M7989 Other specified soft tissue disorders: Secondary | ICD-10-CM | POA: Diagnosis not present

## 2023-04-09 DIAGNOSIS — B353 Tinea pedis: Secondary | ICD-10-CM | POA: Insufficient documentation

## 2023-04-09 MED ORDER — KETOCONAZOLE 2 % EX CREA: 1.0000 | TOPICAL_CREAM | Freq: Two times a day (BID) | CUTANEOUS | 2 refills | Status: AC

## 2023-04-09 NOTE — ED Provider Notes (Signed)
[place] EMERGENCY DEPARTMENT AT [facility] Provider Note   CSN: [id number] Arrival date & time: 04/09/23  1613     History  Chief Complaint  Patient presents with   Skin Problem    Ryan Moss is a 42 y.o. male.  HPI Patient reports he has had a lot of thick very dry cracking skin on his feet.  Patient initially reports this has been going on for 2 to 3 weeks.  However we discussed a longer course of symptoms.  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  He reports he did get thick and irritated skin.  Since that time it is just progressively gotten worse.  Patient reports that now just over the past few days he is gone to wearing loose fitting slide on shoes.  No swelling of the feet or the ankles.  No swelling of the lower legs.  No fever no chills no general constitutional illness.    Home Medications Prior to Admission medications   Medication Sig Start Date End Date Taking? Authorizing Provider  ketoconazole (NIZORAL) 2 % cream Apply 1 Application topically 2 (two) times daily. Place between all toes twice daily and spread thin layer over soles of feet 04/09/23  Yes Blayne Garlick, Lebron Conners, MD  amLODipine (NORVASC) 10 MG tablet Take 1 tablet (10 mg total) by mouth daily. Patient not taking: Reported on 08/18/2022 01/30/22   Eber Hong, MD  cyclobenzaprine (FLEXERIL) 10 MG tablet Take 1 tablet (10 mg total) by mouth 2 (two) times daily as needed for muscle spasms. 10/31/21   Pollyann Savoy, MD  cyclobenzaprine (FLEXERIL) 10 MG tablet Take 1 tablet (10 mg total) by mouth 2 (two) times daily as needed for muscle spasms. 11/12/22   Alvira Monday, MD  naproxen (NAPROSYN) 375 MG tablet Take 1 tablet (375 mg total) by mouth 2 (two) times daily with a meal. 11/12/22   Alvira Monday, MD  naproxen (NAPROSYN) 500 MG tablet Take 1 tablet (500 mg total) by mouth 2 (two) times daily. Patient not  taking: Reported on 08/18/2022 10/31/21   Pollyann Savoy, MD      Allergies    Patient has no known allergies.    Review of Systems   Review of Systems  Physical Exam Updated Vital Signs BP (!) 165/108 (BP Location: Right Arm)   Pulse 97   Temp 98.3 F (36.8 C)   Resp 16   Ht 5\' 8"  (1.727 m)   Wt 78.9 kg   SpO2 99%   BMI 26.46 kg/m  Physical Exam Constitutional:      Comments: Alert nontoxic clinically well in appearance.  Pulmonary:     Effort: Pulmonary effort is normal.  Musculoskeletal:     Comments: No edema of the feet or lower legs.  Patient has very thickened callused skin over the soles of the forefoot and medial aspects of the toes and forefoot.  See attached images.  Between the toes patient has a softer whitish macerated skin.  The toes appear to have been in very close proximity for a long time.  No appearance at this time of secondary infection.  Patient does not have erythema or swelling of the feet.  No open wounds or ulcers.  No signs of cellulitis.  The foot is nontender.  Ankle and lower leg are normal.         ED Results / Procedures / Treatments  Labs (all labs ordered are listed, but only abnormal results are displayed) Labs Reviewed - No data to display  EKG None  Radiology No results found.  Procedures Procedures    Medications Ordered in ED Medications - No data to display  ED Course/ Medical Decision Making/ A&P                                 Medical Decision Making Risk Prescription drug management.  Patient has extreme callusing and findings consistent of tinea pedis.  By examination appears that the toes are most frequently and very close approximation with inadequate foot care.  Patient's medical history includes a diagnosis of prediabetes.  By review of prior lab work patient has consistently had blood glucoses above normal fasting range but not above 200.  Patient is not exhibiting any signs of active diabetes at this  time.  His review of systems is otherwise negative.  We have extensively discussed the importance of close follow-up with the PCP for further monitoring and also evaluation and observation for hypertension.  Patient is exhibiting no signs of endorgan damage from hypertension.  At this time predominant finding is advanced tinea pedis and significant callusing of feet.  We spent considerable time reviewing foot care at home and following up with podiatry as well as PCP.  Patient voices understanding.         Final Clinical Impression(s) / ED Diagnoses Final diagnoses:  Tinea pedis of both feet  Corns and callus    Rx / DC Orders ED Discharge Orders          Ordered    ketoconazole (NIZORAL) 2 % cream  2 times daily        04/09/23 1823              Arby Barrette, MD 04/18/23 1916

## 2023-04-09 NOTE — ED Notes (Signed)
The pt is c/o dry cracked skin on both feet for awhile he has a tiny lesion to his rt thumb also

## 2023-04-09 NOTE — ED Triage Notes (Signed)
Bilateral toes with dry skin and cracking x 2-3 weeks.

## 2023-04-09 NOTE — Discharge Instructions (Signed)
1.  Clean your feet daily.  Wash between your toes and specifically dry.  You may also use a hair dryer to dry between your toes.  Apply the ketoconazole cream between all of the toes and then put a clean dry piece of gauze between the toes to hold them apart.  A thin layer of the cream over the soles of your feet as well.  Wear very clean dry white socks.  Wear shoes that are not tight. 2.  You need to get set up with a family doctor to manage all of your medical problems.  You may use the referral guide to help you find 1 or find a physician that is recommended by family or friends.  Is also referral number in your discharge instructions to help you find a physician taking patients. 3.  You need to schedule follow-up with podiatrist as soon as possible.
